# Patient Record
Sex: Female | Born: 2000
Health system: Southern US, Community
[De-identification: ages and names within clinical notes are randomized; demographics above are authoritative.]

## PROBLEM LIST (undated history)

## (undated) DIAGNOSIS — F32A Depression, unspecified: Secondary | ICD-10-CM

## (undated) DIAGNOSIS — Z9109 Other allergy status, other than to drugs and biological substances: Secondary | ICD-10-CM

## (undated) DIAGNOSIS — D649 Anemia, unspecified: Secondary | ICD-10-CM

## (undated) DIAGNOSIS — F419 Anxiety disorder, unspecified: Secondary | ICD-10-CM

## (undated) DIAGNOSIS — F909 Attention-deficit hyperactivity disorder, unspecified type: Secondary | ICD-10-CM

## (undated) HISTORY — DX: Anemia, unspecified: D64.9

## (undated) HISTORY — DX: Other allergy status, other than to drugs and biological substances: Z91.09

## (undated) HISTORY — DX: Depression, unspecified: F32.A

## (undated) HISTORY — DX: Attention-deficit hyperactivity disorder, unspecified type: F90.9

## (undated) HISTORY — DX: Anxiety disorder, unspecified: F41.9

---
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
## 2020-03-05 MED FILL — ESCITALOPRAM 20 MG TABLET: 20 | 90 days supply | Qty: 90 | Fill #1

## 2020-03-06 ENCOUNTER — Other Ambulatory Visit: Payer: Self-pay | Admitting: Adult Health

## 2020-03-06 DIAGNOSIS — F988 Other specified behavioral and emotional disorders with onset usually occurring in childhood and adolescence: Secondary | ICD-10-CM

## 2020-03-06 MED FILL — buPROPion HCL ER (XL) 150 M: 150 | 90 days supply | Qty: 90 | Fill #0

## 2020-03-14 NOTE — Progress Notes (Signed)
Annual Screening Comprehensive Examination  Assessment and Plan  Encounter for routine adult health examination without abnormal findings Never had meningococcal conjugate - patient declines Discussed meningococcal B and HPV vaccines, declines at this time, information provided and encouraged to discuss with parents Continue follow up with GYN  Health Maintenance- Discussed STD testing, safe sex, alcohol and drug awareness, drinking and driving dangers, wearing a seat belt and general safety measures for young adult.   Acne vulgaris Discussed hygiene Continue medications, improved with topical - discussing doing more regularly Given doxycycline course for persistent cystic lesion of R cheek  H/O tics Improved Monitor, refer back to neuro if needed  Auditory processing disorder Monitor   Attention deficit disorder (ADD) without hyperactivity Continue medications; previously managed by Dr. Laney Pastor, refer back for management if needing significant medication adjustments Helps with focus, no AE's. The patient was counseled on the addictive nature of the medication and was encouraged to take drug holidays when not needed.  Emphasized role of lifestyle, she is working on improving this   BMI 28 - Overweight Long discussion about weight loss, diet, and exercise Recommended diet heavy in fruits and veggies and low in animal meats, cheeses, and dairy products, appropriate calorie intake Patient will work on consistent exercise, developing good nutritional habits - working with nutritionist Discussed appropriate weight for height (below 132 lb) Follow up at next visit  Depression and anxiety Continue lexapro 20 mg, wellbutrin 150 mg daily, fair benefit, declines changes Stress management techniques discussed, increase water, good sleep hygiene discussed, increase exercise, and increase veggies.  Encouraged counseling  Family hx of thyroid dz/depression/weight gain - screening  TSH    Orders Placed This Encounter  Procedures  . CBC with Differential/Platelet  . COMPLETE METABOLIC PANEL WITH GFR  . VITAMIN D 25 Hydroxy (Vit-D Deficiency, Fractures)  . Urinalysis, Routine w reflex microscopic  . TSH    Future Appointments  Date Time Provider [place]  08/18/2020 10:30 AM Tamela Gammon, NP [facility]    HPI   This very nice 19 y.o.female,presents for complete physical. She has ADD (attention deficit disorder); Auditory processing disorder; H/O tics; Acne; and Depression with anxiety on their problem list. Tics have been evaluated by pediatric neuro, recently improved.  Took a year off due to pandemic. Will be starting [employer] college to get CMA then plans to go back for RN. 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 Lives at home, her friend has been living with her.   Newly sexually active with first boyfriend last year, referred to GYN and had IUD placed by Dr. Dellis Filbert, broke up with boyfriend 2 months ago, declines STD testing.   She has been on wellbutrin 979 mg daily, concerta 36 mg BID on school days. she states that the medication is helping  she denies any adverse reactions to medications and reports her appetite is good, sleep.  This year she reported depression/anxiety symptoms associated with new college and pandemic and is now taking lexapro 20 mg and feels this is some . Will be starting with a counselor.   She has acne, has benzamycin topical PRN and differin gel though admits she hasn't been doing this regularly. Has current cystic outbreak of R cheek, has been taking doxycycline her mom had x 3 days.   BMI is Body mass index is 28.77 kg/m., she has been working on diet - gained weight  Elenor Legato is dietician and helping her  make better choices.  - trying to eat healthier - eggs for breakfast instead of sugary cereal, trying to increase vegetables though admits not much. [place]  sandwich later in the day.  Caffeine - occasional monster energy drink, drinks coffee occasionally, not daily  Water intake improved since starting the gym,  Joined a gym - 3 days a week, 60 min, working on cardio and weights.  Wt Readings from Last 3 Encounters:  03/17/20 151 lb (68.5 kg) (83 %, Z= 0.95)*  08/14/19 121 lb (54.9 kg) (42 %, Z= -0.19)*  08/07/19 120 lb 3.2 oz (54.5 kg) (41 %, Z= -0.23)*   * Growth percentiles are based on CDC (Girls, 2-20 Years) data.   Today their BP is BP: 112/70  She does workout. She denies chest pain, shortness of breath, dizziness.  Patient is not on Vitamin D supplement.   No results found for: VD25OH       Current Outpatient Medications on File Prior to Visit  Medication Sig Dispense Refill  . adapalene (DIFFERIN) 0.1 % cream Apply pea sized amount over face nightly 45 g 1  . buPROPion (WELLBUTRIN XL) 150 MG 24 hr tablet TAKE 1 TABLET BY MOUTH DAILY FOR MOOD, FOCUS, AND CONCENTRATION 90 tablet 0  . escitalopram (LEXAPRO) 20 MG tablet TAKE 1 TABLET BY MOUTH DAILY. 90 tablet 1  . methylphenidate (CONCERTA) 36 MG PO CR tablet Take 1 to 2 tablets  Daily as needed for ADD, Focus & Concentration & try limit for occasional Drug Holidays 60 tablet 0   No current facility-administered medications on file prior to visit.    No Known Allergies  Past Medical History:  Diagnosis Date  . ADHD (attention deficit hyperactivity disorder)    Dr. Loura Back  . Anemia     Immunization History  Administered Date(s) Administered  . DTaP 06/14/2001, 08/16/2001, 10/11/2001, 07/11/2002, 06/22/2005  . Hepatitis B 03/22/2001, 06/14/2001, 01/10/2002  . HiB (PRP-OMP) 06/14/2001, 08/16/2001, 10/11/2001, 07/11/2002  . IPV 06/14/2001, 08/16/2001, 01/10/2002, 06/22/2005  . Influenza Nasal 06/15/2007, 10/10/2009, 06/11/2010, 07/05/2011, 08/22/2012  . Influenza,Quad,Nasal, Live 07/19/2013  . Influenza,inj,quad, With Preservative 06/02/2016  . MMR 04/11/2002, 06/22/2005   . Pneumococcal Conjugate-13 06/14/2001, 08/16/2001, 01/10/2002, 06/22/2005  . Tdap 07/05/2011  . Varicella 04/11/2002, 06/22/2005    Meningococcal conjugate - declines HPV - declines today   Last PAP: 08/2019 with GYN Dr. Dellis Filbert   Td due 2022 Covid 19: 2/2, 2021, moderna - will send card  Last eye exam: remote, no concerns  Last dental exam: Dr. Truman Hayward, last 2021, goes q28m   Past Surgical History:  Procedure Laterality Date  . WISDOM TOOTH EXTRACTION  2019    Family History  Problem Relation Age of Onset  . ADD / ADHD Brother   . Diabetes type I Brother   . Hypertension Father   . Migraines Father   . ADD / ADHD Father   . Migraines Maternal Grandmother   . Hypothyroidism Paternal Aunt   . Migraines Maternal Aunt   . Autism Cousin        Maternal 1st Cousin  . ADD / ADHD Cousin        Several Paternal 1st Cousins have ADHD    Social History   Socioeconomic History  . Marital status: Single    Spouse name: Not on file  . Number of children: Not on file  . Years of education: Not on file  . Highest education level: Not on file  Occupational History  . Not on  file  Tobacco Use  . Smoking status: Never Smoker  . Smokeless tobacco: Never Used  Vaping Use  . Vaping Use: Some days  Substance and Sexual Activity  . Alcohol use: No  . Drug use: Never  . Sexual activity: Not Currently    Partners: Male    Birth control/protection: None    Comment: intercourse age 25, less than 5 sexual partners  Other Topics Concern  . 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   Social Determinants of Health   Financial Resource Strain:   . Difficulty of Paying Living Expenses:   Food Insecurity:   . Worried About Charity fundraiser in the Last Year:   . Arboriculturist in the Last Year:   Transportation Needs:   . Film/video editor (Medical):   Marland Kitchen Lack of Transportation (Non-Medical):    Physical Activity:   . Days of Exercise per Week:   . Minutes of Exercise per Session:   Stress:   . Feeling of Stress :   Social Connections:   . Frequency of Communication with Friends and Family:   . Frequency of Social Gatherings with Friends and Family:   . Attends Religious Services:   . Active Member of Clubs or Organizations:   . Attends Archivist Meetings:   Marland Kitchen Marital Status:   Intimate Partner Violence:   . Fear of Current or Ex-Partner:   . Emotionally Abused:   Marland Kitchen Physically Abused:   . Sexually Abused:     ROS Review of Systems  Constitutional: Negative for malaise/fatigue and weight loss.  HENT: Negative for hearing loss and tinnitus.   Eyes: Negative for blurred vision and double vision.  Respiratory: Negative for cough, shortness of breath and wheezing.   Cardiovascular: Negative for chest pain, palpitations, orthopnea, claudication and leg swelling.  Gastrointestinal: Positive for heartburn (occasional). Negative for abdominal pain, blood in stool, constipation, diarrhea, melena, nausea and vomiting.  Genitourinary: Negative.   Musculoskeletal: Negative for joint pain and myalgias.  Skin: Negative for rash.       acne  Neurological: Negative for dizziness, tingling, sensory change, weakness and headaches.  Endo/Heme/Allergies: Negative for polydipsia.  Psychiatric/Behavioral: Negative for depression, memory loss, substance abuse and suicidal ideas. The patient is nervous/anxious. The patient does not have insomnia.   All other systems reviewed and are negative.    Physical Exam  BP 112/70   Pulse (!) 112   Temp (!) 96.8 F (36 C)   Ht 5' 0.75" (1.543 m)   Wt 151 lb (68.5 kg)   BMI 28.77 kg/m   General Appearance: Well nourished, good hygiene and in no apparent distress. Eyes: PERRLA, EOMs, conjunctiva no swelling or erythema, normal fundi and vessels. Sinuses: No frontal/maxillary tenderness ENT/Mouth: EACs patent / TMs  nl. Nares clear  without erythema, swelling, mucoid exudates. Oral hygiene is good. No erythema, swelling, or exudate. Tongue normal, non-obstructing. Tonsils not swollen or erythematous. Hearing normal.  Neck: Supple, thyroid normal. No bruits, nodes or JVD. Respiratory: Respiratory effort normal.  BS equal and clear bilateral without rales, rhonci, wheezing or stridor. Cardio: Heart sounds are normal with increased rate but normal rhythm and no murmurs, rubs or gallops. Peripheral pulses are normal and equal bilaterally without edema. No aortic or femoral bruits. Chest: symmetric with normal excursions and percussion.  Breasts: Defer to GYN Abdomen: Flat, soft, with  bowl sounds. Nontender, no guarding, rebound, hernias, masses, or organomegaly.  Lymphatics: Non tender without lymphadenopathy.  Musculoskeletal: Full ROM all peripheral extremities, joint stability, 5/5 strength, and normal gait. Skin: Warm and dry without rashes, lesions, cyanosis, clubbing or  Ecchymosis. She does have mild pustular acne to forehead and cheeks; large cystic lesion with erythema and some fluctuance, tenderness to R cheek Neuro: Cranial nerves intact, reflexes equal bilaterally. Normal muscle tone, no cerebellar symptoms. Sensation intact.  Pysch: Awake and oriented X 3, normal affect, Insight and Judgment appropriate.  GU: Defer to GYN    Continue prudent diet as discussed, weight control, regular exercise, and medications. Routine screening labs and tests as requested with regular follow-up as recommended.  Over 40 minutes of exam, counseling, chart review and critical decision making was performed  Izora Ribas, NP 10:55 AM [facility]

## 2020-03-17 ENCOUNTER — Other Ambulatory Visit: Payer: Self-pay | Admitting: Adult Health

## 2020-03-17 ENCOUNTER — Other Ambulatory Visit: Payer: Self-pay

## 2020-03-17 ENCOUNTER — Encounter: Payer: Self-pay | Admitting: Adult Health

## 2020-03-17 ENCOUNTER — Ambulatory Visit (INDEPENDENT_AMBULATORY_CARE_PROVIDER_SITE_OTHER): Payer: No Typology Code available for payment source | Admitting: Adult Health

## 2020-03-17 VITALS — BP 112/70 | HR 112 | Temp 96.8°F | Ht 60.75 in | Wt 151.0 lb

## 2020-03-17 DIAGNOSIS — Z0000 Encounter for general adult medical examination without abnormal findings: Secondary | ICD-10-CM | POA: Diagnosis not present

## 2020-03-17 DIAGNOSIS — Z1329 Encounter for screening for other suspected endocrine disorder: Secondary | ICD-10-CM

## 2020-03-17 DIAGNOSIS — L700 Acne vulgaris: Secondary | ICD-10-CM

## 2020-03-17 DIAGNOSIS — F418 Other specified anxiety disorders: Secondary | ICD-10-CM

## 2020-03-17 DIAGNOSIS — F988 Other specified behavioral and emotional disorders with onset usually occurring in childhood and adolescence: Secondary | ICD-10-CM

## 2020-03-17 DIAGNOSIS — R635 Abnormal weight gain: Secondary | ICD-10-CM

## 2020-03-17 DIAGNOSIS — Z130 Encounter for screening for diseases of the blood and blood-forming organs and certain disorders involving the immune mechanism: Secondary | ICD-10-CM

## 2020-03-17 DIAGNOSIS — Z8659 Personal history of other mental and behavioral disorders: Secondary | ICD-10-CM

## 2020-03-17 DIAGNOSIS — Z1389 Encounter for screening for other disorder: Secondary | ICD-10-CM

## 2020-03-17 DIAGNOSIS — E663 Overweight: Secondary | ICD-10-CM

## 2020-03-17 DIAGNOSIS — E669 Obesity, unspecified: Secondary | ICD-10-CM | POA: Insufficient documentation

## 2020-03-17 DIAGNOSIS — Z79899 Other long term (current) drug therapy: Secondary | ICD-10-CM

## 2020-03-17 DIAGNOSIS — Z8349 Family history of other endocrine, nutritional and metabolic diseases: Secondary | ICD-10-CM

## 2020-03-17 DIAGNOSIS — E559 Vitamin D deficiency, unspecified: Secondary | ICD-10-CM

## 2020-03-17 DIAGNOSIS — H9325 Central auditory processing disorder: Secondary | ICD-10-CM

## 2020-03-17 MED ORDER — DOXYCYCLINE HYCLATE 100 MG PO CAPS: ORAL_CAPSULE | ORAL | 0 refills | Status: DC

## 2020-03-17 MED FILL — buPROPion HCL ER (XL) 150 M: 150 | 90 days supply | Qty: 90 | Fill #0

## 2020-03-17 MED FILL — DOXYCYCLINE HYC 100 MG CAPS: 100 | 10 days supply | Qty: 20 | Fill #0

## 2020-03-17 MED FILL — ESCITALOPRAM 20 MG TABLET: 20 | 90 days supply | Qty: 90 | Fill #1

## 2020-03-17 NOTE — Patient Instructions (Addendum)
  Alexandra Horton , Thank you for taking time to come for your Annual Wellness Visit. I appreciate your ongoing commitment to your health goals. Please review the following plan we discussed and let me know if I can assist you in the future.   These are the goals we discussed: Goals    . DIET - EAT MORE FRUITS AND VEGETABLES     Aim for 7+ servings daily, 1/2 cup each    . DIET - INCREASE WATER INTAKE     65 fluid ounces of water daily    . Weight (lb) < 132 lb (59.9 kg)       This is a list of the screening recommended for you and due dates:  Health Maintenance  Topic Date Due  . HIV Screening  03/17/2021*  . Flu Shot  04/06/2020  . Chlamydia screening  08/13/2020  .  Hepatitis C: One time screening is recommended by Center for Disease Control  (CDC) for  adults born from [birth year] through 1965.   Discontinued  *Topic was postponed. The date shown is not the original due date.    Always wash face at the end of the day Recommend differin gel daily in the evening with moisturizer (I like cerave PM) Rinse face in the morning, use sunscreen daily, especially if using any topical meds (not wearing will make acne scarring worse) Benzamycin as needed   Know what a healthy weight is for you (roughly BMI <25) and aim to maintain this  Aim for 7+ servings of fruits and vegetables daily  65-80+ fluid ounces of water or unsweet tea for healthy kidneys  Limit to max 1 drink of alcohol per day; avoid smoking/tobacco  Limit animal fats in diet for cholesterol and heart health - choose grass fed whenever available  Avoid highly processed foods, and foods high in saturated/trans fats  Aim for low stress - take time to unwind and care for your mental health  Aim for 150 min of moderate intensity exercise weekly for heart health, and weights twice weekly for bone health  Aim for 7-9 hours of sleep daily    A great goal to work towards is aiming to get in a serving daily of some of the most  nutritionally dense foods - G- BOMBS daily

## 2020-03-18 ENCOUNTER — Other Ambulatory Visit: Payer: Self-pay | Admitting: Adult Health

## 2020-03-18 DIAGNOSIS — R7989 Other specified abnormal findings of blood chemistry: Secondary | ICD-10-CM | POA: Insufficient documentation

## 2020-03-18 LAB — URINALYSIS, ROUTINE W REFLEX MICROSCOPIC
Bilirubin Urine: NEGATIVE
Glucose, UA: NEGATIVE
Hgb urine dipstick: NEGATIVE
Ketones, ur: NEGATIVE
Leukocytes,Ua: NEGATIVE
Nitrite: NEGATIVE
Protein, ur: NEGATIVE
Specific Gravity, Urine: 1.019 (ref 1.001–1.03)
pH: 6.5 (ref 5.0–8.0)

## 2020-03-18 LAB — CBC WITH DIFFERENTIAL/PLATELET
Absolute Monocytes: 777 cells/uL (ref 200–900)
Basophils Absolute: 29 cells/uL (ref 0–200)
Basophils Relative: 0.5 %
Eosinophils Absolute: 110 cells/uL (ref 15–500)
Eosinophils Relative: 1.9 %
HCT: 38.8 % (ref 34.0–46.0)
Hemoglobin: 13 g/dL (ref 11.5–15.3)
Lymphs Abs: 2134 cells/uL (ref 1200–5200)
MCH: 29.5 pg (ref 25.0–35.0)
MCHC: 33.5 g/dL (ref 31.0–36.0)
MCV: 88 fL (ref 78.0–98.0)
MPV: 11 fL (ref 7.5–12.5)
Monocytes Relative: 13.4 %
Neutro Abs: 2749 cells/uL (ref 1800–8000)
Neutrophils Relative %: 47.4 %
Platelets: 276 10*3/uL (ref 140–400)
RBC: 4.41 10*6/uL (ref 3.80–5.10)
RDW: 12.5 % (ref 11.0–15.0)
Total Lymphocyte: 36.8 %
WBC: 5.8 10*3/uL (ref 4.5–13.0)

## 2020-03-18 LAB — COMPLETE METABOLIC PANEL WITH GFR
AG Ratio: 1.6 (calc) (ref 1.0–2.5)
ALT: 66 U/L — ABNORMAL HIGH (ref 5–32)
AST: 35 U/L — ABNORMAL HIGH (ref 12–32)
Albumin: 4.2 g/dL (ref 3.6–5.1)
Alkaline phosphatase (APISO): 53 U/L (ref 36–128)
BUN: 14 mg/dL (ref 7–20)
CO2: 27 mmol/L (ref 20–32)
Calcium: 9.6 mg/dL (ref 8.9–10.4)
Chloride: 106 mmol/L (ref 98–110)
Creat: 0.69 mg/dL (ref 0.50–1.00)
GFR, Est African American: 147 mL/min/{1.73_m2} (ref >=60)
GFR, Est Non African American: 127 mL/min/{1.73_m2} (ref >=60)
Globulin: 2.6 g/dL (calc) (ref 2.0–3.8)
Glucose, Bld: 109 mg/dL — ABNORMAL HIGH (ref 65–99)
Potassium: 4.5 mmol/L (ref 3.8–5.1)
Sodium: 139 mmol/L (ref 135–146)
Total Bilirubin: 0.6 mg/dL (ref 0.2–1.1)
Total Protein: 6.8 g/dL (ref 6.3–8.2)

## 2020-03-18 LAB — TSH: TSH: 0.83 mIU/L

## 2020-03-18 LAB — VITAMIN D 25 HYDROXY (VIT D DEFICIENCY, FRACTURES): Vit D, 25-Hydroxy: 11 ng/mL — ABNORMAL LOW (ref 30–100)

## 2020-03-18 MED FILL — CONCERTA 36 MG TABLET ER: 36 | 30 days supply | Qty: 60 | Fill #0

## 2020-04-15 ENCOUNTER — Other Ambulatory Visit: Payer: No Typology Code available for payment source

## 2020-04-15 ENCOUNTER — Other Ambulatory Visit: Payer: Self-pay

## 2020-04-15 DIAGNOSIS — R7989 Other specified abnormal findings of blood chemistry: Secondary | ICD-10-CM

## 2020-04-16 LAB — HEPATITIS PANEL, ACUTE
Hep A IgM: NONREACTIVE
Hep B C IgM: NONREACTIVE
Hepatitis B Surface Ag: NONREACTIVE
Hepatitis C Ab: NONREACTIVE
SIGNAL TO CUT-OFF: 0.02 (ref <1.00)

## 2020-04-16 LAB — HEPATIC FUNCTION PANEL
AG Ratio: 1.7 (calc) (ref 1.0–2.5)
ALT: 135 U/L — ABNORMAL HIGH (ref 5–32)
AST: 59 U/L — ABNORMAL HIGH (ref 12–32)
Albumin: 4 g/dL (ref 3.6–5.1)
Alkaline phosphatase (APISO): 56 U/L (ref 36–128)
Bilirubin, Direct: 0.1 mg/dL (ref 0.0–0.2)
Globulin: 2.4 g/dL (calc) (ref 2.0–3.8)
Indirect Bilirubin: 0.2 mg/dL (calc) (ref 0.2–1.1)
Total Bilirubin: 0.3 mg/dL (ref 0.2–1.1)
Total Protein: 6.4 g/dL (ref 6.3–8.2)

## 2020-04-17 ENCOUNTER — Other Ambulatory Visit: Payer: Self-pay | Admitting: Adult Health

## 2020-04-17 DIAGNOSIS — R7989 Other specified abnormal findings of blood chemistry: Secondary | ICD-10-CM

## 2020-05-20 ENCOUNTER — Ambulatory Visit
Admission: RE | Admit: 2020-05-20 | Discharge: 2020-05-20 | Disposition: A | Discharge status: Home / Self Care | Payer: No Typology Code available for payment source | Source: Ambulatory Visit | Attending: Adult Health | Admitting: Adult Health

## 2020-05-20 DIAGNOSIS — R7989 Other specified abnormal findings of blood chemistry: Secondary | ICD-10-CM

## 2020-05-23 ENCOUNTER — Other Ambulatory Visit: Payer: Self-pay | Admitting: Adult Health

## 2020-05-23 DIAGNOSIS — R7989 Other specified abnormal findings of blood chemistry: Secondary | ICD-10-CM

## 2020-06-25 ENCOUNTER — Other Ambulatory Visit: Payer: Self-pay | Admitting: Adult Health

## 2020-06-25 DIAGNOSIS — F988 Other specified behavioral and emotional disorders with onset usually occurring in childhood and adolescence: Secondary | ICD-10-CM

## 2020-06-25 DIAGNOSIS — L709 Acne, unspecified: Secondary | ICD-10-CM

## 2020-06-25 MED ORDER — METHYLPHENIDATE HCL ER (OSM) 36 MG PO TBCR: EXTENDED_RELEASE_TABLET | ORAL | 0 refills | Status: DC

## 2020-06-25 MED FILL — CONCERTA 36 MG TABLET ER: 36 | 30 days supply | Qty: 60 | Fill #0

## 2020-07-07 ENCOUNTER — Ambulatory Visit: Payer: No Typology Code available for payment source

## 2020-07-14 ENCOUNTER — Ambulatory Visit: Payer: Self-pay

## 2020-07-16 ENCOUNTER — Ambulatory Visit: Payer: Self-pay

## 2020-07-30 ENCOUNTER — Other Ambulatory Visit: Payer: Self-pay | Admitting: Adult Health

## 2020-07-30 ENCOUNTER — Other Ambulatory Visit: Payer: Self-pay | Admitting: Internal Medicine

## 2020-07-30 DIAGNOSIS — F988 Other specified behavioral and emotional disorders with onset usually occurring in childhood and adolescence: Secondary | ICD-10-CM

## 2020-07-30 MED FILL — buPROPion HCL ER (XL) 150 M: 150 | 90 days supply | Qty: 90 | Fill #0

## 2020-08-13 ENCOUNTER — Other Ambulatory Visit: Payer: Self-pay | Admitting: Internal Medicine

## 2020-08-13 ENCOUNTER — Other Ambulatory Visit: Payer: Self-pay | Admitting: Adult Health

## 2020-08-13 DIAGNOSIS — F418 Other specified anxiety disorders: Secondary | ICD-10-CM

## 2020-08-13 DIAGNOSIS — F988 Other specified behavioral and emotional disorders with onset usually occurring in childhood and adolescence: Secondary | ICD-10-CM

## 2020-08-13 MED ORDER — METHYLPHENIDATE HCL ER (OSM) 36 MG PO TBCR: EXTENDED_RELEASE_TABLET | ORAL | 0 refills | Status: DC

## 2020-08-13 MED ORDER — ESCITALOPRAM OXALATE 20 MG PO TABS: ORAL_TABLET | ORAL | 0 refills | Status: DC

## 2020-08-13 MED FILL — buPROPion HCL ER (XL) 150 M: 150 | 90 days supply | Qty: 90 | Fill #0

## 2020-08-14 MED FILL — CONCERTA 36 MG TABLET ER: 36 | 30 days supply | Qty: 60 | Fill #0

## 2020-08-14 MED FILL — ESCITALOPRAM 20 MG TABLET: 20 | 90 days supply | Qty: 90 | Fill #0

## 2020-08-18 ENCOUNTER — Encounter: Payer: 59 | Admitting: Nurse Practitioner

## 2020-08-26 ENCOUNTER — Encounter: Payer: No Typology Code available for payment source | Admitting: Nurse Practitioner

## 2020-09-18 ENCOUNTER — Ambulatory Visit: Payer: No Typology Code available for payment source | Admitting: Adult Health

## 2020-09-25 ENCOUNTER — Encounter: Payer: Self-pay | Admitting: Nurse Practitioner

## 2020-09-25 ENCOUNTER — Other Ambulatory Visit: Payer: Self-pay

## 2020-09-25 ENCOUNTER — Ambulatory Visit (INDEPENDENT_AMBULATORY_CARE_PROVIDER_SITE_OTHER): Payer: No Typology Code available for payment source | Admitting: Nurse Practitioner

## 2020-09-25 VITALS — BP 110/64 | HR 80 | Resp 14 | Ht 61.75 in | Wt 165.5 lb

## 2020-09-25 DIAGNOSIS — Z30431 Encounter for routine checking of intrauterine contraceptive device: Secondary | ICD-10-CM

## 2020-09-25 DIAGNOSIS — Z01419 Encounter for gynecological examination (general) (routine) without abnormal findings: Secondary | ICD-10-CM | POA: Diagnosis not present

## 2020-09-25 NOTE — Progress Notes (Signed)
   Alexandra Horton 2000/10/29 [id number]   History:  20 y.o. G0 presents for annual exam without GYN complaints. Amenorrheic/Mirena IUD inserted 08/2019. Has not received Gardasil and is not interested at this time. Not currently sexually active.   Gynecologic History No LMP recorded. (Menstrual status: IUD).   Contraception/Family planning: IUD  Health Maintenance Last Pap: N/A  Past medical history, past surgical history, family history and social history were all reviewed and documented in the EPIC chart.  ROS:  A ROS was performed and pertinent positives and negatives are included.  Exam:  Vitals:   09/25/20 1032  BP: 110/64  Pulse: 80  Resp: 14  Weight: 165 lb 8 oz (75.1 kg)  Height: 5' 1.75" (1.568 m)   Body mass index is 30.52 kg/m.  General appearance:  Normal Thyroid:  Symmetrical, normal in size, without palpable masses or nodularity. Respiratory  Auscultation:  Clear without wheezing or rhonchi Cardiovascular  Auscultation:  Regular rate, without rubs, murmurs or gallops  Edema/varicosities:  Not grossly evident Abdominal  Soft,nontender, without masses, guarding or rebound.  Liver/spleen:  No organomegaly noted  Hernia:  None appreciated  Skin  Inspection:  Grossly normal   Breasts: Examined lying and sitting.   Right: Without masses, retractions, discharge or axillary adenopathy.   Left: Without masses, retractions, discharge or axillary adenopathy. Gentitourinary   Inguinal/mons:  Normal without inguinal adenopathy  External genitalia:  Normal  BUS/Urethra/Skene's glands:  Normal  Vagina:  Normal  Cervix:  Normal, IUD string visible  Uterus:  Normal in size, shape and contour.  Midline and mobile  Adnexa/parametria:     Rt: Without masses or tenderness.   Lt: Without masses or tenderness.  Anus and perineum: Normal  Assessment/Plan:  20 y.o. G0 for annual exam.   Well female exam with routine gynecological exam - Education provided on SBEs,  importance of preventative screenings, current guidelines, high calcium diet, regular exercise, safe sex,  and multivitamin daily.   Encounter for routine checking of intrauterine contraceptive device (IUD) - Mirena inserted !10/2018. Amenorrheic. String visible at external os.  Follow up in 1 year for annual   Olivia Mackie [facility] [place], 10:39 AM 09/25/2020

## 2020-09-25 NOTE — Patient Instructions (Signed)
Health Maintenance, Female Adopting a healthy lifestyle and getting preventive care are important in promoting health and wellness. Ask your health care provider about:  The right schedule for you to have regular tests and exams.  Things you can do on your own to prevent diseases and keep yourself healthy. What should I know about diet, weight, and exercise? Eat a healthy diet  Eat a diet that includes plenty of vegetables, fruits, low-fat dairy products, and lean protein.  Do not eat a lot of foods that are high in solid fats, added sugars, or sodium.   Maintain a healthy weight Body mass index (BMI) is used to identify weight problems. It estimates body fat based on height and weight. Your health care provider can help determine your BMI and help you achieve or maintain a healthy weight. Get regular exercise Get regular exercise. This is one of the most important things you can do for your health. Most adults should:  Exercise for at least 150 minutes each week. The exercise should increase your heart rate and make you sweat (moderate-intensity exercise).  Do strengthening exercises at least twice a week. This is in addition to the moderate-intensity exercise.  Spend less time sitting. Even light physical activity can be beneficial. Watch cholesterol and blood lipids Have your blood tested for lipids and cholesterol at 20 years of age, then have this test every 5 years. Have your cholesterol levels checked more often if:  Your lipid or cholesterol levels are high.  You are older than 20 years of age.  You are at high risk for heart disease. What should I know about cancer screening? Depending on your health history and family history, you may need to have cancer screening at various ages. This may include screening for:  Breast cancer.  Cervical cancer.  Colorectal cancer.  Skin cancer.  Lung cancer. What should I know about heart disease, diabetes, and high blood  pressure? Blood pressure and heart disease  High blood pressure causes heart disease and increases the risk of stroke. This is more likely to develop in people who have high blood pressure readings, are of African descent, or are overweight.  Have your blood pressure checked: ? Every 3-5 years if you are 18-39 years of age. ? Every year if you are 40 years old or older. Diabetes Have regular diabetes screenings. This checks your fasting blood sugar level. Have the screening done:  Once every three years after age 40 if you are at a normal weight and have a low risk for diabetes.  More often and at a younger age if you are overweight or have a high risk for diabetes. What should I know about preventing infection? Hepatitis B If you have a higher risk for hepatitis B, you should be screened for this virus. Talk with your health care provider to find out if you are at risk for hepatitis B infection. Hepatitis C Testing is recommended for:  Everyone born from 1945 through 1965.  Anyone with known risk factors for hepatitis C. Sexually transmitted infections (STIs)  Get screened for STIs, including gonorrhea and chlamydia, if: ? You are sexually active and are younger than 20 years of age. ? You are older than 20 years of age and your health care provider tells you that you are at risk for this type of infection. ? Your sexual activity has changed since you were last screened, and you are at increased risk for chlamydia or gonorrhea. Ask your health care provider   if you are at risk.  Ask your health care provider about whether you are at high risk for HIV. Your health care provider may recommend a prescription medicine to help prevent HIV infection. If you choose to take medicine to prevent HIV, you should first get tested for HIV. You should then be tested every 3 months for as long as you are taking the medicine. Pregnancy  If you are about to stop having your period (premenopausal) and  you may become pregnant, seek counseling before you get pregnant.  Take 400 to 800 micrograms (mcg) of folic acid every day if you become pregnant.  Ask for birth control (contraception) if you want to prevent pregnancy. Osteoporosis and menopause Osteoporosis is a disease in which the bones lose minerals and strength with aging. This can result in bone fractures. If you are 65 years old or older, or if you are at risk for osteoporosis and fractures, ask your health care provider if you should:  Be screened for bone loss.  Take a calcium or vitamin D supplement to lower your risk of fractures.  Be given hormone replacement therapy (HRT) to treat symptoms of menopause. Follow these instructions at home: Lifestyle  Do not use any products that contain nicotine or tobacco, such as cigarettes, e-cigarettes, and chewing tobacco. If you need help quitting, ask your health care provider.  Do not use street drugs.  Do not share needles.  Ask your health care provider for help if you need support or information about quitting drugs. Alcohol use  Do not drink alcohol if: ? Your health care provider tells you not to drink. ? You are pregnant, may be pregnant, or are planning to become pregnant.  If you drink alcohol: ? Limit how much you use to 0-1 drink a day. ? Limit intake if you are breastfeeding.  Be aware of how much alcohol is in your drink. In the [place] one drink equals one 12 oz bottle of beer (355 mL), one 5 oz glass of wine (148 mL), or one 1 oz glass of hard liquor (44 mL). General instructions  Schedule regular health, dental, and eye exams.  Stay current with your vaccines.  Tell your health care provider if: ? You often feel depressed. ? You have ever been abused or do not feel safe at home. Summary  Adopting a healthy lifestyle and getting preventive care are important in promoting health and wellness.  Follow your health care provider's instructions about healthy  diet, exercising, and getting tested or screened for diseases.  Follow your health care provider's instructions on monitoring your cholesterol and blood pressure. This information is not intended to replace advice given to you by your health care provider. Make sure you discuss any questions you have with your health care provider. Document Revised: 08/16/2018 Document Reviewed: 08/16/2018 Elsevier Patient Education  2021 [employer] Inc[employer]  

## 2020-10-01 ENCOUNTER — Ambulatory Visit (INDEPENDENT_AMBULATORY_CARE_PROVIDER_SITE_OTHER): Payer: No Typology Code available for payment source | Admitting: Adult Health

## 2020-10-01 ENCOUNTER — Other Ambulatory Visit: Payer: Self-pay | Admitting: Adult Health

## 2020-10-01 ENCOUNTER — Encounter: Payer: Self-pay | Admitting: Adult Health

## 2020-10-01 ENCOUNTER — Other Ambulatory Visit: Payer: Self-pay

## 2020-10-01 VITALS — BP 120/82 | HR 95 | Temp 97.3°F | Wt 170.0 lb

## 2020-10-01 DIAGNOSIS — F988 Other specified behavioral and emotional disorders with onset usually occurring in childhood and adolescence: Secondary | ICD-10-CM

## 2020-10-01 DIAGNOSIS — E66811 Obesity, class 1: Secondary | ICD-10-CM

## 2020-10-01 DIAGNOSIS — E669 Obesity, unspecified: Secondary | ICD-10-CM | POA: Diagnosis not present

## 2020-10-01 DIAGNOSIS — F418 Other specified anxiety disorders: Secondary | ICD-10-CM

## 2020-10-01 MED ORDER — SERTRALINE HCL 100 MG PO TABS: 100.0000 mg | ORAL_TABLET | Freq: Every day | ORAL | 2 refills | Status: DC

## 2020-10-01 MED FILL — SERTRALINE HCL 100 MG TABS: 100 | 30 days supply | Qty: 30 | Fill #0
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
## 2020-10-01 NOTE — Patient Instructions (Signed)
Goals    . DIET - EAT MORE FRUITS AND VEGETABLES     Aim for 7+ servings daily, 1/2 cup each    . DIET - INCREASE WATER INTAKE     65 fluid ounces of water daily    . Weight (lb) < 132 lb (59.9 kg)       Try to reduce processed foods - carbohydrates and fried stuff   Stop lexapro - start zoloft 1/2 tab daily for 2 weeks then whole tab daily    High-Fiber Eating Plan Fiber, also called dietary fiber, is a type of carbohydrate. It is found foods such as fruits, vegetables, whole grains, and beans. A high-fiber diet can have many health benefits. Your health care provider may recommend a high-fiber diet to help:  Prevent constipation. Fiber can make your bowel movements more regular.  Lower your cholesterol.  Relieve the following conditions: ? Inflammation of veins in the anus (hemorrhoids). ? Inflammation of specific areas of the digestive tract (uncomplicated diverticulosis). ? A problem of the large intestine, also called the colon, that sometimes causes pain and diarrhea (irritable bowel syndrome, or IBS).  Prevent overeating as part of a weight-loss plan.  Prevent heart disease, type 2 diabetes, and certain cancers. What are tips for following this plan? Reading food labels  Check the nutrition facts label on food products for the amount of dietary fiber. Choose foods that have 5 grams of fiber or more per serving.  The goals for recommended daily fiber intake include: ? Men (age 89 or younger): 34-38 g. ? Men (over age 18): 28-34 g. ? Women (age 44 or younger): 25-28 g. ? Women (over age 31): 22-25 g. Your daily fiber goal is _____________ g.   Shopping  Choose whole fruits and vegetables instead of processed forms, such as apple juice or applesauce.  Choose a wide variety of high-fiber foods such as avocados, lentils, oats, and kidney beans.  Read the nutrition facts label of the foods you choose. Be aware of foods with added fiber. These foods often have high  sugar and sodium amounts per serving. Cooking  Use whole-grain flour for baking and cooking.  Cook with brown rice instead of white rice. Meal planning  Start the day with a breakfast that is high in fiber, such as a cereal that contains 5 g of fiber or more per serving.  Eat breads and cereals that are made with whole-grain flour instead of refined flour or white flour.  Eat brown rice, bulgur wheat, or millet instead of white rice.  Use beans in place of meat in soups, salads, and pasta dishes.  Be sure that half of the grains you eat each day are whole grains. General information  You can get the recommended daily intake of dietary fiber by: ? Eating a variety of fruits, vegetables, grains, nuts, and beans. ? Taking a fiber supplement if you are not able to take in enough fiber in your diet. It is better to get fiber through food than from a supplement.  Gradually increase how much fiber you consume. If you increase your intake of dietary fiber too quickly, you may have bloating, cramping, or gas.  Drink plenty of water to help you digest fiber.  Choose high-fiber snacks, such as berries, raw vegetables, nuts, and popcorn. What foods should I eat? Fruits Berries. Pears. Apples. Oranges. Avocado. Prunes and raisins. Dried figs. Vegetables Sweet potatoes. Spinach. Kale. Artichokes. Cabbage. Broccoli. Cauliflower. Green peas. Carrots. Squash. Grains Whole-grain breads.  Multigrain cereal. Oats and oatmeal. Brown rice. Barley. Bulgur wheat. Millet. Quinoa. Bran muffins. Popcorn. Rye wafer crackers. Meats and other proteins Navy beans, kidney beans, and pinto beans. Soybeans. Split peas. Lentils. Nuts and seeds. Dairy Fiber-fortified yogurt. Beverages Fiber-fortified soy milk. Fiber-fortified orange juice. Other foods Fiber bars. The items listed above may not be a complete list of recommended foods and beverages. Contact a dietitian for more information. What foods should I  avoid? Fruits Fruit juice. Cooked, strained fruit. Vegetables Fried potatoes. Canned vegetables. Well-cooked vegetables. Grains White bread. Pasta made with refined flour. White rice. Meats and other proteins Fatty cuts of meat. Fried chicken or fried fish. Dairy Milk. Yogurt. Cream cheese. Sour cream. Fats and oils Butters. Beverages Soft drinks. Other foods Cakes and pastries. The items listed above may not be a complete list of foods and beverages to avoid. Talk with your dietitian about what choices are best for you. Summary  Fiber is a type of carbohydrate. It is found in foods such as fruits, vegetables, whole grains, and beans.  A high-fiber diet has many benefits. It can help to prevent constipation, lower blood cholesterol, aid weight loss, and reduce your risk of heart disease, diabetes, and certain cancers.  Increase your intake of fiber gradually. Increasing fiber too quickly may cause cramping, bloating, and gas. Drink plenty of water while you increase the amount of fiber you consume.  The best sources of fiber include whole fruits and vegetables, whole grains, nuts, seeds, and beans. This information is not intended to replace advice given to you by your health care provider. Make sure you discuss any questions you have with your health care provider. Document Revised: 12/27/2019 Document Reviewed: 12/27/2019 Elsevier Patient Education  2021 Elsevier Inc.      Sertraline Tablets What is this medicine? SERTRALINE (SER tra leen) is used to treat depression. It may also be used to treat obsessive compulsive disorder, panic disorder, post-trauma stress disorder, premenstrual dysphoric disorder (PMDD) or social anxiety. This medicine may be used for other purposes; ask your health care provider or pharmacist if you have questions. COMMON BRAND NAME(S): Zoloft What should I tell my health care provider before I take this medicine? They need to know if you have any  of these conditions:  bleeding disorders  bipolar disorder or a family history of bipolar disorder  glaucoma  heart disease  high blood pressure  history of irregular heartbeat  history of low levels of calcium, magnesium, or potassium in the blood  if you often drink alcohol  liver disease  receiving electroconvulsive therapy  seizures  suicidal thoughts, plans, or attempt; a previous suicide attempt by you or a family member  take medicines that treat or prevent blood clots  thyroid disease  an unusual or allergic reaction to sertraline, other medicines, foods, dyes, or preservatives  pregnant or trying to get pregnant  breast-feeding How should I use this medicine? Take this medicine by mouth with a glass of water. Follow the directions on the prescription label. You can take it with or without food. Take your medicine at regular intervals. Do not take your medicine more often than directed. Do not stop taking this medicine suddenly except upon the advice of your doctor. Stopping this medicine too quickly may cause serious side effects or your condition may worsen. A special MedGuide will be given to you by the pharmacist with each prescription and refill. Be sure to read this information carefully each time. Talk to your pediatrician regarding  the use of this medicine in children. While this drug may be prescribed for children as young as 7 years for selected conditions, precautions do apply. Overdosage: If you think you have taken too much of this medicine contact a poison control center or emergency room at once. NOTE: This medicine is only for you. Do not share this medicine with others. What if I miss a dose? If you miss a dose, take it as soon as you can. If it is almost time for your next dose, take only that dose. Do not take double or extra doses. What may interact with this medicine? Do not take this medicine with any of the following  medications:  cisapride  dronedarone  linezolid  MAOIs like Carbex, Eldepryl, Marplan, Nardil, and Parnate  methylene blue (injected into a vein)  pimozide  thioridazine This medicine may also interact with the following medications:  alcohol  amphetamines  aspirin and aspirin-like medicines  certain medicines for depression, anxiety, or psychotic disturbances  certain medicines for fungal infections like ketoconazole, fluconazole, posaconazole, and itraconazole  certain medicines for irregular heart beat like flecainide, quinidine, propafenone  certain medicines for migraine headaches like almotriptan, eletriptan, frovatriptan, naratriptan, rizatriptan, sumatriptan, zolmitriptan  certain medicines for sleep  certain medicines for seizures like carbamazepine, valproic acid, phenytoin  certain medicines that treat or prevent blood clots like warfarin, enoxaparin, dalteparin  cimetidine  digoxin  diuretics  fentanyl  isoniazid  lithium  NSAIDs, medicines for pain and inflammation, like ibuprofen or naproxen  other medicines that prolong the QT interval (cause an abnormal heart rhythm) like dofetilide  rasagiline  safinamide  supplements like St. John's wort, kava kava, valerian  tolbutamide  tramadol  tryptophan This list may not describe all possible interactions. Give your health care provider a list of all the medicines, herbs, non-prescription drugs, or dietary supplements you use. Also tell them if you smoke, drink alcohol, or use illegal drugs. Some items may interact with your medicine. What should I watch for while using this medicine? Tell your doctor if your symptoms do not get better or if they get worse. Visit your doctor or health care professional for regular checks on your progress. Because it may take several weeks to see the full effects of this medicine, it is important to continue your treatment as prescribed by your doctor. Patients  and their families should watch out for new or worsening thoughts of suicide or depression. Also watch out for sudden changes in feelings such as feeling anxious, agitated, panicky, irritable, hostile, aggressive, impulsive, severely restless, overly excited and hyperactive, or not being able to sleep. If this happens, especially at the beginning of treatment or after a change in dose, call your health care professional. Bonita Quin may get drowsy or dizzy. Do not drive, use machinery, or do anything that needs mental alertness until you know how this medicine affects you. Do not stand or sit up quickly, especially if you are an older patient. This reduces the risk of dizzy or fainting spells. Alcohol may interfere with the effect of this medicine. Avoid alcoholic drinks. Your mouth may get dry. Chewing sugarless gum or sucking hard candy, and drinking plenty of water may help. Contact your doctor if the problem does not go away or is severe. What side effects may I notice from receiving this medicine? Side effects that you should report to your doctor or health care professional as soon as possible:  allergic reactions like skin rash, itching or hives, swelling of  the face, lips, or tongue  anxious  black, tarry stools  changes in vision  confusion  elevated mood, decreased need for sleep, racing thoughts, impulsive behavior  eye pain  fast, irregular heartbeat  feeling faint or lightheaded, falls  feeling agitated, angry, or irritable  hallucination, loss of contact with reality  loss of balance or coordination  loss of memory  painful or prolonged erections  restlessness, pacing, inability to keep still  seizures  stiff muscles  suicidal thoughts or other mood changes  trouble sleeping  unusual bleeding or bruising  unusually weak or tired  vomiting Side effects that usually do not require medical attention (report to your doctor or health care professional if they  continue or are bothersome):  change in appetite or weight  change in sex drive or performance  diarrhea  increased sweating  indigestion, nausea  tremors This list may not describe all possible side effects. Call your doctor for medical advice about side effects. You may report side effects to FDA at 1-800-FDA-1088. Where should I keep my medicine? Keep out of the reach of children. Store at room temperature between 15 and 30 degrees C (59 and 86 degrees F). Throw away any unused medicine after the expiration date. NOTE: This sheet is a summary. It may not cover all possible information. If you have questions about this medicine, talk to your doctor, pharmacist, or health care provider.  2021 Elsevier/Gold Standard (2020-07-03 09:18:23)

## 2020-10-31 ENCOUNTER — Encounter: Payer: Self-pay | Admitting: Nurse Practitioner

## 2020-11-12 NOTE — Telephone Encounter (Signed)
Appointment has been schedule for March 14.

## 2020-11-17 ENCOUNTER — Ambulatory Visit: Payer: No Typology Code available for payment source | Admitting: Nurse Practitioner

## 2020-11-22 ENCOUNTER — Encounter (INDEPENDENT_AMBULATORY_CARE_PROVIDER_SITE_OTHER): Payer: Self-pay

## 2020-11-22 ENCOUNTER — Other Ambulatory Visit: Payer: Self-pay

## 2020-11-22 ENCOUNTER — Other Ambulatory Visit: Payer: Self-pay | Admitting: Adult Health

## 2020-11-22 DIAGNOSIS — F988 Other specified behavioral and emotional disorders with onset usually occurring in childhood and adolescence: Secondary | ICD-10-CM

## 2020-11-22 MED ORDER — METHYLPHENIDATE HCL ER (OSM) 36 MG PO TBCR: EXTENDED_RELEASE_TABLET | ORAL | 0 refills | Status: DC

## 2020-11-22 MED ORDER — BUPROPION HCL ER (XL) 150 MG PO TB24: ORAL_TABLET | ORAL | 3 refills | Status: DC

## 2020-11-25 ENCOUNTER — Other Ambulatory Visit: Payer: Self-pay

## 2020-11-25 ENCOUNTER — Ambulatory Visit: Payer: No Typology Code available for payment source | Admitting: Nurse Practitioner

## 2020-11-25 ENCOUNTER — Encounter: Payer: Self-pay | Admitting: Nurse Practitioner

## 2020-11-25 VITALS — BP 100/60 | HR 72 | Wt 169.0 lb

## 2020-11-25 DIAGNOSIS — Z113 Encounter for screening for infections with a predominantly sexual mode of transmission: Secondary | ICD-10-CM

## 2020-11-25 DIAGNOSIS — Z30431 Encounter for routine checking of intrauterine contraceptive device: Secondary | ICD-10-CM

## 2020-11-25 DIAGNOSIS — R1030 Lower abdominal pain, unspecified: Secondary | ICD-10-CM | POA: Diagnosis not present

## 2020-11-25 NOTE — Progress Notes (Signed)
   Acute Office Visit  Subjective:    Patient ID: Alexandra Horton, female    DOB: 03-02-2001, 20 y.o.   MRN: [id number]   HPI 20 y.o. presents today for lower abdominal pain. This started a couple of months ago, is intermittent, pain level 4/10, and alternates sides. Nothing makes it better and nothing makes it worse. Amenorrheic. Mirena IUD inserted 08/2019. Not sexually active since October 2021. Reports normal bowel habits.   Review of Systems  Constitutional: Negative.   Gastrointestinal: Positive for abdominal pain. Negative for constipation, diarrhea, nausea and vomiting.  Genitourinary: Negative for menstrual problem and vaginal bleeding.       Objective:    Physical Exam Constitutional:      Appearance: Normal appearance.  Abdominal:     Tenderness: There is abdominal tenderness in the right lower quadrant and left lower quadrant. There is no guarding or rebound.  Genitourinary:    General: Normal vulva.     Vagina: Normal.     Cervix: Normal.     Uterus: Normal.      Comments: IUD string visible at exocervix - about 3 cm in length    BP 100/60 (BP Location: Right Arm, Patient Position: Sitting, Cuff Size: Normal)   Pulse 72   Wt 169 lb (76.7 kg)   LMP  (LMP Unknown)   BMI 31.16 kg/m  Wt Readings from Last 3 Encounters:  11/25/20 169 lb (76.7 kg) (91 %, Z= 1.37)*  10/01/20 170 lb (77.1 kg) (92 %, Z= 1.40)*  09/25/20 165 lb 8 oz (75.1 kg) (90 %, Z= 1.29)*   * Growth percentiles are based on CDC (Girls, 2-20 Years) data.        Assessment & Plan:   Problem List Items Addressed This Visit   None   Visit Diagnoses    Intermittent lower abdominal pain    -  Primary   Relevant Orders   US PELVIS TRANSVAGINAL NON-OB (TV ONLY)   Encounter for routine checking of intrauterine contraceptive device (IUD)       Screen for STD (sexually transmitted disease)       Relevant Orders   SURESWAB CT/NG/T. vaginalis      Plan: Reassurance provided on correct IUD  placement. Pain could be cystic in nature and because it has been persistent over 2-3 months we will schedule a pelvic ultrasound to rule out ovarian or uterine abnormalities. Ibuprofen as needed for pain. Screening for GC/chlamydia/trich today. She is agreeable to plan.     Olivia Mackie DNP, 4:12 PM 11/25/2020

## 2020-11-26 ENCOUNTER — Ambulatory Visit (INDEPENDENT_AMBULATORY_CARE_PROVIDER_SITE_OTHER): Payer: No Typology Code available for payment source | Admitting: Adult Health

## 2020-11-26 ENCOUNTER — Other Ambulatory Visit: Payer: Self-pay | Admitting: Adult Health

## 2020-11-26 ENCOUNTER — Encounter: Payer: Self-pay | Admitting: Adult Health

## 2020-11-26 VITALS — BP 100/64 | HR 95 | Temp 97.5°F | Wt 173.0 lb

## 2020-11-26 DIAGNOSIS — F418 Other specified anxiety disorders: Secondary | ICD-10-CM

## 2020-11-26 DIAGNOSIS — H6002 Abscess of left external ear: Secondary | ICD-10-CM | POA: Diagnosis not present

## 2020-11-26 DIAGNOSIS — R7989 Other specified abnormal findings of blood chemistry: Secondary | ICD-10-CM

## 2020-11-26 DIAGNOSIS — L709 Acne, unspecified: Secondary | ICD-10-CM

## 2020-11-26 DIAGNOSIS — E669 Obesity, unspecified: Secondary | ICD-10-CM

## 2020-11-26 DIAGNOSIS — G43009 Migraine without aura, not intractable, without status migrainosus: Secondary | ICD-10-CM | POA: Diagnosis not present

## 2020-11-26 LAB — SURESWAB CT/NG/T. VAGINALIS
C. trachomatis RNA, TMA: NOT DETECTED
N. gonorrhoeae RNA, TMA: NOT DETECTED
Trichomonas vaginalis RNA: NOT DETECTED

## 2020-11-26 MED ORDER — CIPROFLOXACIN HCL 500 MG PO TABS: 500.0000 mg | ORAL_TABLET | Freq: Two times a day (BID) | ORAL | 0 refills | Status: DC

## 2020-11-26 MED ORDER — ADAPALENE 0.1 % EX GEL: Freq: Every day | CUTANEOUS | 0 refills | Status: DC

## 2020-11-26 MED ORDER — SERTRALINE HCL 100 MG PO TABS: 100.0000 mg | ORAL_TABLET | Freq: Every day | ORAL | 3 refills | Status: DC

## 2020-11-26 MED ORDER — RIZATRIPTAN BENZOATE 10 MG PO TABS: 10.0000 mg | ORAL_TABLET | Freq: Once | ORAL | 2 refills | Status: DC | PRN

## 2020-11-26 MED FILL — RIZATRIPTAN BENZOATE 10 MG: 10 | 30 days supply | Qty: 18 | Fill #0

## 2020-11-26 MED FILL — CIPROFLOXACIN HCL 500 MG TA: 500 | 10 days supply | Qty: 20 | Fill #0

## 2020-11-26 NOTE — Patient Instructions (Addendum)
Goals    . DIET - EAT MORE FRUITS AND VEGETABLES     Aim for 7+ servings daily, 1/2 cup each    . DIET - INCREASE WATER INTAKE     65 fluid ounces of water daily    . Weight (lb) < 158 lb (71.7 kg)       Please cut out processed carbohydrate - sugar, high fructose corn syrup Avoid flour unless whole grain (try Dave's killer bread)  Beans, fresh fruits/veggies, whole grains, nuts/seeds, etc    High-Fiber Eating Plan Fiber, also called dietary fiber, is a type of carbohydrate. It is found foods such as fruits, vegetables, whole grains, and beans. A high-fiber diet can have many health benefits. Your health care provider may recommend a high-fiber diet to help:  Prevent constipation. Fiber can make your bowel movements more regular.  Lower your cholesterol.  Relieve the following conditions: ? Inflammation of veins in the anus (hemorrhoids). ? Inflammation of specific areas of the digestive tract (uncomplicated diverticulosis). ? A problem of the large intestine, also called the colon, that sometimes causes pain and diarrhea (irritable bowel syndrome, or IBS).  Prevent overeating as part of a weight-loss plan.  Prevent heart disease, type 2 diabetes, and certain cancers. What are tips for following this plan? Reading food labels  Check the nutrition facts label on food products for the amount of dietary fiber. Choose foods that have 5 grams of fiber or more per serving.  The goals for recommended daily fiber intake include: ? Men (age 68 or younger): 34-38 g. ? Men (over age 46): 28-34 g. ? Women (age 72 or younger): 25-28 g. ? Women (over age 67): 22-25 g. Your daily fiber goal is _____________ g.   Shopping  Choose whole fruits and vegetables instead of processed forms, such as apple juice or applesauce.  Choose a wide variety of high-fiber foods such as avocados, lentils, oats, and kidney beans.  Read the nutrition facts label of the foods you choose. Be aware of  foods with added fiber. These foods often have high sugar and sodium amounts per serving. Cooking  Use whole-grain flour for baking and cooking.  Cook with brown rice instead of white rice. Meal planning  Start the day with a breakfast that is high in fiber, such as a cereal that contains 5 g of fiber or more per serving.  Eat breads and cereals that are made with whole-grain flour instead of refined flour or white flour.  Eat brown rice, bulgur wheat, or millet instead of white rice.  Use beans in place of meat in soups, salads, and pasta dishes.  Be sure that half of the grains you eat each day are whole grains. General information  You can get the recommended daily intake of dietary fiber by: ? Eating a variety of fruits, vegetables, grains, nuts, and beans. ? Taking a fiber supplement if you are not able to take in enough fiber in your diet. It is better to get fiber through food than from a supplement.  Gradually increase how much fiber you consume. If you increase your intake of dietary fiber too quickly, you may have bloating, cramping, or gas.  Drink plenty of water to help you digest fiber.  Choose high-fiber snacks, such as berries, raw vegetables, nuts, and popcorn. What foods should I eat? Fruits Berries. Pears. Apples. Oranges. Avocado. Prunes and raisins. Dried figs. Vegetables Sweet potatoes. Spinach. Kale. Artichokes. Cabbage. Broccoli. Cauliflower. Green peas. Carrots. Squash. Grains Whole-grain  breads. Multigrain cereal. Oats and oatmeal. Brown rice. Barley. Bulgur wheat. Millet. Quinoa. Bran muffins. Popcorn. Rye wafer crackers. Meats and other proteins Navy beans, kidney beans, and pinto beans. Soybeans. Split peas. Lentils. Nuts and seeds. Dairy Fiber-fortified yogurt. Beverages Fiber-fortified soy milk. Fiber-fortified orange juice. Other foods Fiber bars. The items listed above may not be a complete list of recommended foods and beverages. Contact a  dietitian for more information. What foods should I avoid? Fruits Fruit juice. Cooked, strained fruit. Vegetables Fried potatoes. Canned vegetables. Well-cooked vegetables. Grains White bread. Pasta made with refined flour. White rice. Meats and other proteins Fatty cuts of meat. Fried chicken or fried fish. Dairy Milk. Yogurt. Cream cheese. Sour cream. Fats and oils Butters. Beverages Soft drinks. Other foods Cakes and pastries. The items listed above may not be a complete list of foods and beverages to avoid. Talk with your dietitian about what choices are best for you. Summary  Fiber is a type of carbohydrate. It is found in foods such as fruits, vegetables, whole grains, and beans.  A high-fiber diet has many benefits. It can help to prevent constipation, lower blood cholesterol, aid weight loss, and reduce your risk of heart disease, diabetes, and certain cancers.  Increase your intake of fiber gradually. Increasing fiber too quickly may cause cramping, bloating, and gas. Drink plenty of water while you increase the amount of fiber you consume.  The best sources of fiber include whole fruits and vegetables, whole grains, nuts, seeds, and beans. This information is not intended to replace advice given to you by your health care provider. Make sure you discuss any questions you have with your health care provider. Document Revised: 12/27/2019 Document Reviewed: 12/27/2019 Elsevier Patient Education  2021 Elsevier Inc.       Fatty liver or Nonalcoholic fatty liver disease (NASH)  Now the leading cause of liver failure in the [place].  It is normally from such risk factors as obesity, diabetes, insulin resistance, high cholesterol, or metabolic syndrome.  The only definitive therapy is weight loss and exercise.    Suggest walking 20-30 mins daily.  Decreasing carbohydrates, increasing veggies.  Vitamin E 800 IU a day may be beneficial. Liver cancer has been noted  in patient with fatty liver without cirrhosis.  Will monitor closely   Fatty Liver Fatty liver is the accumulation of fat in liver cells. It is also called hepatosteatosis or steatohepatitis. It is normal for your liver to contain some fat. If fat is more than 5 to 10% of your liver's weight, you have fatty liver.  There are often no symptoms (problems) for years while damage is still occurring. People often learn about their fatty liver when they have medical tests for other reasons. Fat can damage your liver for years or even decades without causing problems. When it becomes severe, it can cause fatigue, weight loss, weakness, and confusion. This makes you more likely to develop more serious liver problems. The liver is the largest organ in the body. It does a lot of work and often gives no warning signs when it is sick until late in a disease. The liver has many important jobs including:  Breaking down foods.  Storing vitamins, iron, and other minerals.  Making proteins.  Making bile for food digestion.  Breaking down many products including medications, alcohol and some poisons.  PROGNOSIS  Fatty liver may cause no damage or it can lead to an inflammation of the liver. This is, called steatohepatitis.  Over  time the liver may become scarred and hardened. This condition is called cirrhosis. Cirrhosis is serious and may lead to liver failure or cancer. NASH is one of the leading causes of cirrhosis. About 10-20% of Americans have fatty liver and a smaller 2-5% has NASH.  TREATMENT   Weight loss, fat restriction, and exercise in overweight patients produces inconsistent results but is worth trying.  Good control of diabetes may reduce fatty liver.  Eat a balanced, healthy diet.  Increase your physical activity.  There are no medical or surgical treatments for a fatty liver or NASH, but improving your diet and increasing your exercise may help prevent or reverse some of the  damage.

## 2020-11-26 NOTE — Progress Notes (Signed)
Assessment and Plan:  Alexandra Horton was seen today for acute visit.  Diagnoses and all orders for this visit:  Abscess of left earlobe I&D in office with verbal consent; no local, prepped with alcohol, very small stab incision with 11 blade; tolerated well with moderate purulent discharge, cultured; procedure tolerated well; topical abx/bandaid applied She can take OTC analgesic PRN mild/mod pain if needed  Will do cipro for anaerobe coverage pending cultre due to likely secondary to piercing Hygiene reviewed; follow up if not resolving or with any recurrent sx; do not reuse piercing -     ciprofloxacin (CIPRO) 500 MG tablet; Take 1 tablet (500 mg total) by mouth 2 (two) times daily for 10 days. -     Culture, Wound  Elevated LFTs Likely fatty liver secondary to 40 lb weight gain Weight loss advised, avoid alcohol/tylenol, low processed carb diet, will monitor LFTs -     Hepatic function panel -     Iron, TIBC and Ferritin Panel  Obesity (BMI 30.0-34.9) Long discussion about weight loss, diet, and exercise Recommended diet heavy in fruits and veggies and low in animal meats, cheeses, and dairy products, appropriate calorie intake Discussed appropriate weight for height and initial goal (<158lb) Follow up at next visit  Migraine without aura and without status migrainosus, not intractable -     rizatriptan (MAXALT) 10 MG tablet; Take 1 tablet (10 mg total) by mouth once as needed for migraine. May repeat in 2 hours if needed  Acne, unspecified acne type Will refer for cystic lesions and patient preference Discussed hygiene; continue avoiding comedogenic, wash face twice daily, remove makeup nightly; panoxyl at night, try differin daily except twice weekly try topical BHA, cerave moisturizer with niacinamide -     Ambulatory referral to Dermatology -     adapalene (DIFFERIN) 0.1 % gel; Apply topically at bedtime.  Depression/anxiety Improved; Continue medications  Lifestyle discussed:  diet/exerise, sleep hygiene, stress management, hydration -     sertraline (ZOLOFT) 100 MG tablet; Take 1 tablet (100 mg total) by mouth daily.   Further disposition pending results of labs. Discussed med's effects and SE's.   Over 30 minutes of exam, counseling, chart review, and critical decision making was performed.   Future Appointments  Date Time Provider Department Center  12/24/2020  4:00 PM Judd Gaudier, NP [facility] None  12/30/2020  1:30 PM [facility] Alexandra Horton RM 1 [facility] None  12/30/2020  2:20 PM Amundson Shirley Friar, MD [facility] None  03/30/2021 10:00 AM Judd Gaudier, NP [facility] None    ------------------------------------------------------------------------------------------------------------------   HPI BP 100/64   Pulse 95   Temp (!) 97.5 F (36.4 C)   Wt 173 lb (78.5 kg)   LMP  (LMP Unknown)   SpO2 97%   BMI 31.90 kg/m   20 y.o.female presents for evaluation of lump left ear.   She reports noted small tender lump behind her left ear between earrings 3-4 days ago, below piercing she got 3+ months ago, no irritation at piercing site, did remote piercing. Has noted more swollen and tender over the last few days (scant clear discharge), denies fever/chills, mildly tender but denies significant pain. Tried hot compress last night without much change.   She reports having left sided migraine without aura, occasionally with nausea, with photosensitivity, sounds and smells bother her. Tylenol/ibuprofenexcedrine haven't helped, having a few every 3-4 weeks. Will sleep in dark room to resolve. Requesting refill of maxalt which has been prescribed in the past which  worked well without SE.   Also with recent significant weight gain during pandemic (121 lb in 08/2019 to 151 lb in 03/2020, now up to 173 lb)  and new elevated LFTs at last CPE in July 2021, had negative hepatitis panel, never returned for recheck or iron/ferritin check. Alexandra Horton abd 05/2020 showed Hepatic  parenchymal echogenicity may be mildly increased with some inhomogeneity of the hepatic echotexture. Could reflect intrinsic liver disease or fatty infiltration. No focal liver lesions.  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  Wt Readings from Last 3 Encounters:  11/27/18 173 lb (78.5 kg) (93 %, Z= 1.46)*  11/25/20 169 lb (76.7 kg) (91 %, Z= 1.37)*  10/01/20 170 lb (77.1 kg) (92 %, Z= 1.40)*   * Growth percentiles are based on CDC (Girls, 2-20 Years) data.    Lab Results  Component Value Date   ALT 135 (H) 04/15/2020   AST 59 (H) 04/15/2020   BILITOT 0.3 04/15/2020   Also persistent acne since teen, has some cystic lesions and scarring to cheeks; pustules to chin. Doing panoxyl, topical niacinamide product, PRN topical benzyl peroxide; has tried Differin but not consistently. Has been on doxycycline and topical benzyl/erythromycin in the past. She is on IUD via GYN. She is requesting derm referral due to cystic lesions and scarring.   Has been on concerta 36 mg daily for many years for ADHD; continues to take PRN. Also on wellbutrin 150 mg daily and sertraline 100 mg with improvement in mood/anxiety.     Past Medical History:  Diagnosis Date  . ADHD (attention deficit hyperactivity disorder)    Dr. Verdie Mosher  . Anemia      No Known Allergies  Current Outpatient Medications on File Prior to Visit  Medication Sig  . buPROPion (WELLBUTRIN XL) 150 MG 24 hr tablet Take     1 tablet     Daily      for Mood, Focus & VConcentration  . methylphenidate (CONCERTA) 36 MG PO CR tablet Take 1 to 2 tablets Daily for ADD, Focus & Concentration & try to Limit for Occasional Drug Holidays  . sertraline (ZOLOFT) 100 MG tablet Take 1 tablet (100 mg total) by mouth daily.   No current facility-administered medications on file prior to visit.    ROS: all  negative except above.   Physical Exam:  BP 100/64   Pulse 95   Temp (!) 97.5 F (36.4 C)   Wt 173 lb (78.5 kg)   LMP  (LMP Unknown)   SpO2 97%   BMI 31.90 kg/m   General Appearance: Well nourished teen, obese, well dressed/good hygiene in no apparent distress. Eyes: PERRLA, conjunctiva no swelling or erythema ENT/Mouth: Ext aud canals clear, TMs without erythema, bulging. No erythema, swelling, or exudate on post pharynx.  Tonsils not swollen or erythematous. Hearing normal. Mastoid non-tender.  Neck: Supple, thyroid normal.  Respiratory: Respiratory effort normal, BS equal bilaterally without rales, rhonchi, wheezing or stridor.  Cardio: RRR with no MRGs. Brisk peripheral pulses without edema.  Abdomen: Soft, + BS.  Non tender, no guarding, rebound, hernias, masses. Lymphatics: Non tender without lymphadenopathy.  Musculoskeletal: No obvious deformity, normal gait.  Skin: Warm, dry; cheeks with 2-3 deep cystic lesions to each cheek, mild/mod scarring to bil cheeks, 5-6 pustular lesions to chin Left ear lob with fluctuant tender erythematous  area, below lob piercing hole, head to posterior lobe Neuro: Cranial nerves intact. Normal muscle tone, no cerebellar symptoms. Sensation intact.  Psych: Awake and oriented X 3, normal affect, Insight and Judgment appropriate.     Dan Maker, NP 2:38 PM [facility]

## 2020-11-27 LAB — IRON,TIBC AND FERRITIN PANEL
%SAT: 9 % (calc) — ABNORMAL LOW (ref 15–45)
Ferritin: 30 ng/mL (ref 16–154)
Iron: 38 ug/dL (ref 27–164)
TIBC: 402 mcg/dL (calc) (ref 271–448)

## 2020-11-27 LAB — HEPATIC FUNCTION PANEL
AG Ratio: 1.7 (calc) (ref 1.0–2.5)
ALT: 27 U/L (ref 5–32)
AST: 19 U/L (ref 12–32)
Albumin: 4.2 g/dL (ref 3.6–5.1)
Alkaline phosphatase (APISO): 57 U/L (ref 36–128)
Bilirubin, Direct: 0.1 mg/dL (ref 0.0–0.2)
Globulin: 2.5 g/dL (calc) (ref 2.0–3.8)
Indirect Bilirubin: 0.2 mg/dL (calc) (ref 0.2–1.1)
Total Bilirubin: 0.3 mg/dL (ref 0.2–1.1)
Total Protein: 6.7 g/dL (ref 6.3–8.2)

## 2020-11-30 LAB — WOUND CULTURE
MICRO NUMBER:: 11685309
RESULT:: NO GROWTH
SPECIMEN QUALITY:: ADEQUATE

## 2020-12-08 ENCOUNTER — Other Ambulatory Visit (HOSPITAL_COMMUNITY): Payer: Self-pay

## 2020-12-08 ENCOUNTER — Other Ambulatory Visit: Payer: Self-pay | Admitting: Adult Health

## 2020-12-08 MED ORDER — SERTRALINE HCL 100 MG PO TABS
ORAL_TABLET | Freq: Every day | ORAL | 3 refills | Status: DC
  Filled 2020-12-08: qty 90, 90d supply, fill #0

## 2020-12-10 ENCOUNTER — Other Ambulatory Visit (HOSPITAL_COMMUNITY): Payer: Self-pay

## 2020-12-24 ENCOUNTER — Ambulatory Visit: Payer: Self-pay | Admitting: Adult Health

## 2020-12-30 ENCOUNTER — Encounter: Payer: Self-pay | Admitting: Obstetrics and Gynecology

## 2020-12-30 ENCOUNTER — Ambulatory Visit (INDEPENDENT_AMBULATORY_CARE_PROVIDER_SITE_OTHER): Payer: No Typology Code available for payment source

## 2020-12-30 ENCOUNTER — Ambulatory Visit: Payer: No Typology Code available for payment source | Admitting: Obstetrics and Gynecology

## 2020-12-30 ENCOUNTER — Other Ambulatory Visit: Payer: Self-pay

## 2020-12-30 VITALS — BP 122/84 | HR 84 | Resp 16 | Ht 61.75 in | Wt 169.0 lb

## 2020-12-30 DIAGNOSIS — R1030 Lower abdominal pain, unspecified: Secondary | ICD-10-CM | POA: Diagnosis not present

## 2020-12-30 DIAGNOSIS — G8929 Other chronic pain: Secondary | ICD-10-CM | POA: Diagnosis not present

## 2020-12-30 DIAGNOSIS — R1032 Left lower quadrant pain: Secondary | ICD-10-CM

## 2020-12-30 DIAGNOSIS — M545 Low back pain, unspecified: Secondary | ICD-10-CM | POA: Diagnosis not present

## 2020-12-30 NOTE — Progress Notes (Signed)
GYNECOLOGY  VISIT   HPI: 20 y.o.   Single  Caucasian  female   G0P0000 with No LMP recorded. (Menstrual status: IUD).   here for pelvic ultrasound for LLQ pelvic pain of a few months duration.  It is occasional sharp cramping that can last for 10 minutes.  Also having lower back bilateral cramping.  Neg GC/CT/trich on 11/25/20.   Mirena IUD inserted in Dec, 2020.  No menses.  She was having irregular and painful menses prior to this.   Has a BM about every day.  No diarrhea.   No dysuria or blood in her urine.  Hx UTIs but none in about a year.   Cares for an 19 year old girl. Wants to move to [place]. 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  GYNECOLOGIC HISTORY: No LMP recorded. (Menstrual status: IUD). Contraception:  Mirena IUD 08/2019 Menopausal hormone therapy:  n/a Last mammogram:  n/a Last pap smear:   n/a        OB History    Gravida  0   Para  0   Term  0   Preterm  0   AB  0   Living  0     SAB  0   IAB  0   Ectopic  0   Multiple  0   Live Births  0              Patient Active Problem List   Diagnosis Date Noted  . Migraine without aura and without status migrainosus, not intractable 11/26/2020  . Elevated LFTs 03/18/2020  . Obesity (BMI 30.0-34.9) 03/17/2020  . Depression with anxiety 03/08/2019  . Acne 11/15/2017  . H/O tics 01/03/2014  . Auditory processing disorder 05/04/2013  . ADD (attention deficit disorder) 07/05/2011    Past Medical History:  Diagnosis Date  . ADHD (attention deficit hyperactivity disorder)    Dr. Verdie Mosher  . Anemia     Past Surgical History:  Procedure Laterality Date  . WISDOM TOOTH EXTRACTION  2019    Current Outpatient Medications  Medication Sig Dispense Refill  . adapalene (DIFFERIN) 0.1 % gel APPLY TO THE AFFECTED AREA(S) AT BEDTIME 45 g 0  . buPROPion (WELLBUTRIN XL) 150 MG 24 hr tablet TAKE 1 TABLET BY MOUTH DAILY FOR MOOD, FOCUS & CONCENTRATION 90 tablet 3  .  methylphenidate 36 MG PO CR tablet TAKE 1 TO 2 TABLETS DAILY FOR ADD, FOCUS & CONCENTRATION & TRY TO LIMIT FOR OCCASIONAL DRUG HOLIDAYS 60 tablet 0  . rizatriptan (MAXALT) 10 MG tablet TAKE 1 TABLET BY MOUTH ONCE AS NEEDED FOR MIGRAINE, MAY REPEAT IN 2 HOURS AS NEEDED 30 tablet 2  . sertraline (ZOLOFT) 100 MG tablet TAKE 1 TABLET BY MOUTH ONCE A DAY 90 tablet 3   No current facility-administered medications for this visit.     ALLERGIES: Patient has no known allergies.  Family History  Problem Relation Age of Onset  . ADD / ADHD Brother   . Diabetes type I Brother   . Hypertension Father   . Migraines Father   . ADD / ADHD Father   . Migraines Maternal Grandmother   . Hypothyroidism Paternal Aunt   . Migraines Maternal Aunt   . Autism Cousin        Maternal 1st Cousin  . ADD / ADHD Cousin        Several Paternal 1st Cousins have ADHD    Social History   Socioeconomic History  .  Marital status: Single    Spouse name: Not on file  . Number of children: Not on file  . Years of education: Not on file  . Highest education level: Not on file  Occupational History  . Not on file  Tobacco Use  . Smoking status: Never Smoker  . Smokeless tobacco: Never Used  Vaping Use  . Vaping Use: Some days  Substance and Sexual Activity  . Alcohol use: No  . Drug use: Never  . Sexual activity: Not Currently    Partners: Male    Birth control/protection: None    Comment: intercourse age 65, less than 5 sexual partners  Other Topics Concern  . 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   Social Determinants of Health   Financial Resource Strain: Not on file  Food Insecurity: Not on file  Transportation Needs: Not on file  Physical Activity: Not on file  Stress: Not on file  Social Connections: Not on file  Intimate Partner Violence: Not on file    Review of Systems  All other systems reviewed and are  negative.   PHYSICAL EXAMINATION:    BP 122/84 (Cuff Size: Large)   Pulse 84   Resp 16   Ht 5' 1.75" (1.568 m)   Wt 169 lb (76.7 kg)   BMI 31.16 kg/m     General appearance: alert, cooperative and appears stated age  Pelvic US Normal uterus.  IUD in endometrial canal.  Normal ovaries. Normal perfusion.  No adnexal masses. No free fluid.   Chaperone was present for exam.  ASSESSMENT  LLQ pain.  Lower back pain. Bilateral. Chronic.  Migraine without aura.  Hx elevated LFTs, which normalized.   PLAN  Reassurance regarding [place] images and report.  We discussed limits of [place] to detect endometriosis.  She declines adding a birth control pill, ring or patch to her current regimen to control pain.  I recommended dietary changes and trying a probiotic. Fu prn.   24 min  total time was spent for this patient encounter, including preparation, face-to-face counseling with the patient, coordination of care, and documentation of the encounter.

## 2021-01-07 ENCOUNTER — Other Ambulatory Visit: Payer: Self-pay | Admitting: Adult Health

## 2021-01-07 ENCOUNTER — Other Ambulatory Visit (HOSPITAL_COMMUNITY): Payer: Self-pay

## 2021-01-07 MED ORDER — SERTRALINE HCL 100 MG PO TABS
150.0000 mg | ORAL_TABLET | Freq: Every day | ORAL | 3 refills | Status: DC
  Filled 2021-01-07 – 2021-03-03 (×4): qty 135, 90d supply, fill #0
  Filled 2021-06-04: qty 135, 90d supply, fill #1
  Filled 2021-07-11 – 2021-08-19 (×3): qty 135, 90d supply, fill #2

## 2021-01-08 MED FILL — Bupropion HCl Tab ER 24HR 150 MG: ORAL | 90 days supply | Qty: 90 | Fill #0 | Status: CN

## 2021-01-09 ENCOUNTER — Telehealth: Payer: Self-pay | Admitting: Adult Health

## 2021-01-09 ENCOUNTER — Other Ambulatory Visit (HOSPITAL_COMMUNITY): Payer: Self-pay

## 2021-01-09 MED FILL — Bupropion HCl Tab ER 24HR 150 MG: ORAL | 90 days supply | Qty: 90 | Fill #0 | Status: CN

## 2021-01-09 NOTE — Telephone Encounter (Signed)
Called patient to schedule, Left voicemail to call office to schedule recommended office visit to evaluate, per [employer].  Re: poor appetite, please advise patient to schedule an appointment to come see me if not improving in 7-10 days, or sooner if any significant pain or other specific concerns. Recommend she try bland diet such as soup in the interim for upset stomach with plenty of fluids.

## 2021-01-17 ENCOUNTER — Other Ambulatory Visit (HOSPITAL_COMMUNITY): Payer: Self-pay

## 2021-01-17 MED FILL — Rizatriptan Benzoate Tab 10 MG (Base Equivalent): ORAL | 30 days supply | Qty: 18 | Fill #0 | Status: CN

## 2021-01-19 ENCOUNTER — Other Ambulatory Visit (HOSPITAL_COMMUNITY): Payer: Self-pay

## 2021-01-27 ENCOUNTER — Other Ambulatory Visit (HOSPITAL_COMMUNITY): Payer: Self-pay

## 2021-02-10 ENCOUNTER — Encounter: Payer: Self-pay | Admitting: Nurse Practitioner

## 2021-02-11 ENCOUNTER — Ambulatory Visit: Payer: No Typology Code available for payment source | Admitting: Nurse Practitioner

## 2021-02-11 NOTE — Progress Notes (Deleted)
GYNECOLOGY  VISIT  CC:   ***  HPI: 20 y.o. G0P0000 Single White or Caucasian female here for bleeding with iud.     GYNECOLOGIC HISTORY: No LMP recorded. (Menstrual status: IUD). Contraception: mirena iud inserted 09-05-2019 Menopausal hormone therapy: none  Patient Active Problem List   Diagnosis Date Noted   Migraine without aura and without status migrainosus, not intractable 11/26/2020   Elevated LFTs 03/18/2020   Obesity (BMI 30.0-34.9) 03/17/2020   Depression with anxiety 03/08/2019   Acne 11/15/2017   H/O tics 01/03/2014   Auditory processing disorder 05/04/2013   ADD (attention deficit disorder) 07/05/2011    Past Medical History:  Diagnosis Date   ADHD (attention deficit hyperactivity disorder)    Dr. Verdie Mosher   Anemia     Past Surgical History:  Procedure Laterality Date   WISDOM TOOTH EXTRACTION  2019    MEDS:   Current Outpatient Medications on File Prior to Visit  Medication Sig Dispense Refill   adapalene (DIFFERIN) 0.1 % gel APPLY TO THE AFFECTED AREA(S) AT BEDTIME 45 g 0   buPROPion (WELLBUTRIN XL) 150 MG 24 hr tablet TAKE 1 TABLET BY MOUTH DAILY FOR MOOD, FOCUS & CONCENTRATION 90 tablet 3   methylphenidate 36 MG PO CR tablet TAKE 1 TO 2 TABLETS DAILY FOR ADD, FOCUS & CONCENTRATION & TRY TO LIMIT FOR OCCASIONAL DRUG HOLIDAYS 60 tablet 0   rizatriptan (MAXALT) 10 MG tablet TAKE 1 TABLET BY MOUTH ONCE AS NEEDED FOR MIGRAINE, MAY REPEAT IN 2 HOURS AS NEEDED 30 tablet 2   sertraline (ZOLOFT) 100 MG tablet Take 1 & 1/2 tablets (150 mg total) by mouth daily. 135 tablet 3   [DISCONTINUED] escitalopram (LEXAPRO) 20 MG tablet Take      1 tablet      Daily       for Mood 90 tablet 0   No current facility-administered medications on file prior to visit.    ALLERGIES: Patient has no known allergies.  Family History  Problem Relation Age of Onset   ADD / ADHD Brother    Diabetes type I Brother    Hypertension Father    Migraines Father    ADD / ADHD Father     Migraines Maternal Grandmother    Hypothyroidism Paternal Aunt    Migraines Maternal Aunt    Autism Cousin        Maternal 1st Cousin   ADD / ADHD Cousin        Several Paternal 1st Cousins have ADHD     Review of Systems  PHYSICAL EXAMINATION:    There were no vitals taken for this visit.    General appearance: alert, cooperative, no acute distress  CV:  {Exam; heart brief:31539} Lungs:  {pe lungs ob:314451::"clear to auscultation, no wheezes, rales or rhonchi, symmetric air entry"} Breasts: {Exam; breast:13139::"normal appearance, no masses or tenderness"} Abdomen: soft, non-tender; no masses,  no organomegaly Lymph:  no inguinal LAD noted  Pelvic: External genitalia:  no lesions              Urethra:  normal appearing urethra with no masses, tenderness or lesions              Bartholins and Skenes: normal                 Vagina: normal appearing vagina               Cervix: {CHL AMB PHY EX CERVIX NORM DEFAULT:[phone]::"no lesions"}  Bimanual Exam:  Uterus:  {CHL AMB PHY EX UTERUS NORM DEFAULT:[phone]::"normal size, contour, position, consistency, mobility, non-tender"}              Adnexa: {CHL AMB PHY EX ADNEXA NO MASS [phone]::"no mass, fullness, tenderness"}               Chaperone, ***, CMA, was present for exam.  Assessment:   Plan:    {NUMBERS; -10-45 JOINT ROM:10287} minutes of total time was spent for this patient encounter, including preparation, face-to-face counseling with the patient and coordination of care, and documentation of the encounter.

## 2021-02-12 ENCOUNTER — Other Ambulatory Visit (HOSPITAL_COMMUNITY): Payer: Self-pay

## 2021-02-12 MED FILL — Rizatriptan Benzoate Tab 10 MG (Base Equivalent): ORAL | 30 days supply | Qty: 18 | Fill #0 | Status: AC

## 2021-02-16 ENCOUNTER — Other Ambulatory Visit: Payer: Self-pay

## 2021-02-16 ENCOUNTER — Other Ambulatory Visit (HOSPITAL_COMMUNITY): Payer: Self-pay

## 2021-02-16 ENCOUNTER — Ambulatory Visit: Payer: No Typology Code available for payment source | Admitting: Nurse Practitioner

## 2021-02-16 MED FILL — Bupropion HCl Tab ER 24HR 150 MG: ORAL | 90 days supply | Qty: 90 | Fill #0 | Status: CN

## 2021-02-17 ENCOUNTER — Other Ambulatory Visit (HOSPITAL_COMMUNITY)
Admission: RE | Admit: 2021-02-17 | Discharge: 2021-02-17 | Disposition: A | Discharge status: Home / Self Care | Payer: No Typology Code available for payment source | Source: Ambulatory Visit | Attending: Obstetrics and Gynecology | Admitting: Obstetrics and Gynecology

## 2021-02-17 ENCOUNTER — Ambulatory Visit: Payer: No Typology Code available for payment source | Admitting: Obstetrics and Gynecology

## 2021-02-17 ENCOUNTER — Other Ambulatory Visit (HOSPITAL_COMMUNITY): Payer: Self-pay

## 2021-02-17 ENCOUNTER — Other Ambulatory Visit: Payer: Self-pay

## 2021-02-17 ENCOUNTER — Encounter: Payer: Self-pay | Admitting: Obstetrics and Gynecology

## 2021-02-17 VITALS — BP 118/70 | Ht 61.5 in | Wt 168.0 lb

## 2021-02-17 DIAGNOSIS — Z113 Encounter for screening for infections with a predominantly sexual mode of transmission: Secondary | ICD-10-CM | POA: Diagnosis not present

## 2021-02-17 DIAGNOSIS — R102 Pelvic and perineal pain: Secondary | ICD-10-CM | POA: Diagnosis not present

## 2021-02-17 LAB — PREGNANCY, URINE: Preg Test, Ur: NEGATIVE

## 2021-02-17 MED ORDER — CYCLOBENZAPRINE HCL 10 MG PO TABS
10.0000 mg | ORAL_TABLET | Freq: Three times a day (TID) | ORAL | 0 refills | Status: DC | PRN
  Filled 2021-02-17: qty 30, 10d supply, fill #0

## 2021-02-17 NOTE — Patient Instructions (Signed)
Cyclobenzaprine tablets What is this medication? CYCLOBENZAPRINE (sye kloe BEN za preen) is a muscle relaxer. It is used totreat muscle pain, spasms, and stiffness. This medicine may be used for other purposes; ask your health care provider orpharmacist if you have questions. COMMON BRAND NAME(S): Fexmid, Flexeril What should I tell my care team before I take this medication? They need to know if you have any of these conditions: heart disease, irregular heartbeat, or previous heart attack liver disease thyroid problem an unusual or allergic reaction to cyclobenzaprine, tricyclic antidepressants, lactose, other medicines, foods, dyes, or preservatives pregnant or trying to get pregnant breast-feeding How should I use this medication? Take this medicine by mouth with a glass of water. Follow the directions on the prescription label. If this medicine upsets your stomach, take it with food or milk. Take your medicine at regular intervals. Do not take it more often thandirected. Talk to your pediatrician regarding the use of this medicine in children.Special care may be needed. Overdosage: If you think you have taken too much of this medicine contact apoison control center or emergency room at once. NOTE: This medicine is only for you. Do not share this medicine with others. What if I miss a dose? If you miss a dose, take it as soon as you can. If it is almost time for yournext dose, take only that dose. Do not take double or extra doses. What may interact with this medication? Do not take this medicine with any of the following medications: MAOIs like Carbex, Eldepryl, Marplan, Nardil, and Parnate narcotic medicines for cough safinamide This medicine may also interact with the following medications: alcohol bupropion antihistamines for allergy, cough and cold certain medicines for anxiety or sleep certain medicines for bladder problems like oxybutynin, tolterodine certain medicines for  depression like amitriptyline, fluoxetine, sertraline certain medicines for Parkinson's disease like benztropine, trihexyphenidyl certain medicines for seizures like phenobarbital, primidone certain medicines for stomach problems like dicyclomine, hyoscyamine certain medicines for travel sickness like scopolamine general anesthetics like halothane, isoflurane, methoxyflurane, propofol ipratropium local anesthetics like lidocaine, pramoxine, tetracaine medicines that relax muscles for surgery narcotic medicines for pain phenothiazines like chlorpromazine, mesoridazine, prochlorperazine, thioridazine verapamil This list may not describe all possible interactions. Give your health care provider a list of all the medicines, herbs, non-prescription drugs, or dietary supplements you use. Also tell them if you smoke, drink alcohol, or use illegaldrugs. Some items may interact with your medicine. What should I watch for while using this medication? Tell your doctor or health care professional if your symptoms do not start toget better or if they get worse. You may get drowsy or dizzy. Do not drive, use machinery, or do anything that needs mental alertness until you know how this medicine affects you. Do not stand or sit up quickly, especially if you are an older patient. This reduces the risk of dizzy or fainting spells. Alcohol may interfere with the effect ofthis medicine. Avoid alcoholic drinks. If you are taking another medicine that also causes drowsiness, you may have more side effects. Give your health care provider a list of all medicines you use. Your doctor will tell you how much medicine to take. Do not take more medicine than directed. Call emergency for help if you have problems breathingor unusual sleepiness. Your mouth may get dry. Chewing sugarless gum or sucking hard candy, and drinking plenty of water may help. Contact your doctor if the problem does notgo away or is severe. What side  effects may I  notice from receiving this medication? Side effects that you should report to your doctor or health care professionalas soon as possible: allergic reactions like skin rash, itching or hives, swelling of the face, lips, or tongue breathing problems chest pain fast, irregular heartbeat hallucinations seizures unusually weak or tired Side effects that usually do not require medical attention (report to yourdoctor or health care professional if they continue or are bothersome): headache nausea, vomiting This list may not describe all possible side effects. Call your doctor for medical advice about side effects. You may report side effects to FDA at1-800-FDA-1088. Where should I keep my medication? Keep out of the reach of children. Store at room temperature between 15 and 30 degrees C (59 and 86 degrees F). Keep container tightly closed. Throw away any unused medicine after theexpiration date. NOTE: This sheet is a summary. It may not cover all possible information. If you have questions about this medicine, talk to your doctor, pharmacist, orhealth care provider.  2022 Elsevier/Gold Standard (2018-07-26 12:49:26)

## 2021-02-17 NOTE — Progress Notes (Signed)
GYNECOLOGY  VISIT   HPI: 20 y.o.   Single  Caucasian  female   G0P0000 with No LMP recorded. (Menstrual status: IUD).   here for severe cramping on and off x 6 months.    States her cramping is worse.  Lasts 30 - 60 min or more and then goes away and returns.  Pain is sudden and more left sided. States she is unable to move when the pain occurs. No relationship of pain the anything else.  Advil does not help.  No real bleeding at the time of her pain.   Over one year since she had a period, but had two episodes of a lot of bleeding and clotting. Bleeding only lasted for 15 min or less.   Had pelvic US on 12/30/20 showing IUD in endometrial canal and normal ovaries.   Bowel movements are a few time every day.  Consistence is normal.   No dysuria.  No bleed in her urine.   No sexual partner currently.  Last partner was April once.  Female partner, no condom use.   Still satisfied with the IUD and prefers this to Nexplanon or a pill.  States breast tenderness.  No changes in her health.  She is trying to change her diet and start exercising also.  States she has put on weight.   Wants to do forensic nursing.   GYNECOLOGIC HISTORY: No LMP recorded. (Menstrual status: IUD). Contraception: Mirena dec/2020 Menopausal hormone therapy:  n/a Last mammogram:  n/a Last pap smear:  n/a        OB History     Gravida  0   Para  0   Term  0   Preterm  0   AB  0   Living  0      SAB  0   IAB  0   Ectopic  0   Multiple  0   Live Births  0              Patient Active Problem List   Diagnosis Date Noted   Migraine without aura and without status migrainosus, not intractable 11/26/2020   Elevated LFTs 03/18/2020   Obesity (BMI 30.0-34.9) 03/17/2020   Depression with anxiety 03/08/2019   Acne 11/15/2017   H/O tics 01/03/2014   Auditory processing disorder 05/04/2013   ADD (attention deficit disorder) 07/05/2011    Past Medical History:  Diagnosis  Date   ADHD (attention deficit hyperactivity disorder)    Dr. Verdie Mosher   Anemia     Past Surgical History:  Procedure Laterality Date   WISDOM TOOTH EXTRACTION  2019    Current Outpatient Medications  Medication Sig Dispense Refill   adapalene (DIFFERIN) 0.1 % gel APPLY TO THE AFFECTED AREA(S) AT BEDTIME 45 g 0   buPROPion (WELLBUTRIN XL) 150 MG 24 hr tablet TAKE 1 TABLET BY MOUTH DAILY FOR MOOD, FOCUS & CONCENTRATION 90 tablet 3   methylphenidate 36 MG PO CR tablet TAKE 1 TO 2 TABLETS DAILY FOR ADD, FOCUS & CONCENTRATION & TRY TO LIMIT FOR OCCASIONAL DRUG HOLIDAYS 60 tablet 0   rizatriptan (MAXALT) 10 MG tablet TAKE 1 TABLET BY MOUTH ONCE AS NEEDED FOR MIGRAINE, MAY REPEAT IN 2 HOURS AS NEEDED 30 tablet 2   sertraline (ZOLOFT) 100 MG tablet Take 1 & 1/2 tablets (150 mg total) by mouth daily. 135 tablet 3   No current facility-administered medications for this visit.     ALLERGIES: Lactose intolerance (gi)  Family History  Problem Relation Age of Onset   ADD / ADHD Brother    Diabetes type I Brother    Hypertension Father    Migraines Father    ADD / ADHD Father    Migraines Maternal Grandmother    Hypothyroidism Paternal Aunt    Migraines Maternal Aunt    Autism Cousin        Maternal 1st Cousin   ADD / ADHD Cousin        Several Paternal 1st Cousins have ADHD    Social History   Socioeconomic History   Marital status: Single    Spouse name: Not on file   Number of children: Not on file   Years of education: Not on file   Highest education level: Not on file  Occupational History   Not on file  Tobacco Use   Smoking status: Never   Smokeless tobacco: Never  Vaping Use   Vaping Use: Some days  Substance and Sexual Activity   Alcohol use: No   Drug use: Never   Sexual activity: Not Currently    Partners: Male    Birth control/protection: None    Comment: intercourse age 53, less than 5 sexual partners  Other Topics Concern   Not on file  Social History  Narrative   [employer]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   Social Determinants of Health   Financial Resource Strain: Not on file  Food Insecurity: Not on file  Transportation Needs: Not on file  Physical Activity: Not on file  Stress: Not on file  Social Connections: Not on file  Intimate Partner Violence: Not on file    Review of Systems  Constitutional: Negative.   HENT: Negative.    Eyes: Negative.   Respiratory: Negative.    Cardiovascular: Negative.   Gastrointestinal: Negative.   Endocrine: Negative.   Genitourinary: Negative.   Musculoskeletal: Negative.   Skin: Negative.   Allergic/Immunologic: Negative.   Neurological: Negative.   Hematological: Negative.   Psychiatric/Behavioral: Negative.     PHYSICAL EXAMINATION:    BP 118/70   Ht 5' 1.5" (1.562 m)   Wt 168 lb (76.2 kg)   BMI 31.23 kg/m     General appearance: alert, cooperative and appears stated age   Pelvic: External genitalia:  no lesions              Urethra:  normal appearing urethra with no masses, tenderness or lesions              Bartholins and Skenes: normal                 Vagina: normal appearing vagina with normal color and discharge, no lesions              Cervix: no lesions                Bimanual Exam:  Uterus:  normal size, contour, position, consistency, mobility, non-tender on right.  Some tenderness on left side.               Adnexa: no mass, fullness, tenderness                Chaperone was present for exam.  ASSESSMENT  Pelvic pain.  Uncertain etiology.  Endometriosis?  Fibrosis?  Bowel origin?  Musculoskeletal? Mirena IUD.  Migraine without aura.  PLAN  GC/CT/trichomonas testing.  She declines a course of combined oral contraception.  Does not  tolerate well. Try a course of Flexeril. Check UPT now.  We discussed laparoscopy as a way to diagnose and treat endometriosis and adhesive disease. FU prn.   24 min total time was spent for this  patient encounter, including preparation, face-to-face counseling with the patient, coordination of care, and documentation of the encounter.

## 2021-02-18 LAB — CERVICOVAGINAL ANCILLARY ONLY
Chlamydia: NEGATIVE
Comment: NEGATIVE
Comment: NEGATIVE
Comment: NORMAL
Neisseria Gonorrhea: NEGATIVE
Trichomonas: NEGATIVE

## 2021-02-24 ENCOUNTER — Other Ambulatory Visit (HOSPITAL_COMMUNITY): Payer: Self-pay

## 2021-02-26 ENCOUNTER — Other Ambulatory Visit (HOSPITAL_COMMUNITY): Payer: Self-pay

## 2021-03-03 ENCOUNTER — Encounter: Payer: Self-pay | Admitting: Nurse Practitioner

## 2021-03-03 ENCOUNTER — Other Ambulatory Visit: Payer: Self-pay | Admitting: Adult Health

## 2021-03-03 DIAGNOSIS — F988 Other specified behavioral and emotional disorders with onset usually occurring in childhood and adolescence: Secondary | ICD-10-CM

## 2021-03-03 MED FILL — Bupropion HCl Tab ER 24HR 150 MG: ORAL | 90 days supply | Qty: 90 | Fill #0 | Status: AC

## 2021-03-04 ENCOUNTER — Other Ambulatory Visit (HOSPITAL_COMMUNITY): Payer: Self-pay

## 2021-03-04 MED ORDER — METHYLPHENIDATE HCL ER (OSM) 36 MG PO TBCR
EXTENDED_RELEASE_TABLET | ORAL | 0 refills | Status: DC
  Filled 2021-03-04: qty 60, 30d supply, fill #0

## 2021-03-11 ENCOUNTER — Encounter: Payer: Self-pay | Admitting: Nurse Practitioner

## 2021-03-13 NOTE — Telephone Encounter (Signed)
Please schedule patient for OV with provider. It does not have to be today.

## 2021-03-30 ENCOUNTER — Encounter: Payer: No Typology Code available for payment source | Admitting: Adult Health

## 2021-04-23 ENCOUNTER — Other Ambulatory Visit (HOSPITAL_COMMUNITY): Payer: Self-pay

## 2021-04-23 ENCOUNTER — Other Ambulatory Visit: Payer: Self-pay | Admitting: Adult Health

## 2021-04-23 DIAGNOSIS — F988 Other specified behavioral and emotional disorders with onset usually occurring in childhood and adolescence: Secondary | ICD-10-CM

## 2021-04-23 MED ORDER — METHYLPHENIDATE HCL ER (OSM) 36 MG PO TBCR
EXTENDED_RELEASE_TABLET | ORAL | 0 refills | Status: DC
Start: 2021-04-23 — End: 2021-06-03
  Filled 2021-04-23: qty 60, 30d supply, fill #0

## 2021-04-23 MED FILL — Rizatriptan Benzoate Tab 10 MG (Base Equivalent): ORAL | 30 days supply | Qty: 18 | Fill #1 | Status: AC

## 2021-04-29 NOTE — Progress Notes (Signed)
Complete Physical  Assessment and Plan:  Noelene was seen today for annual exam.  Diagnoses and all orders for this visit:  Encounter for routine adult health examination without abnormal findings  Due Annually  Refused Pap, will plan for next year  Refused Tetanus, will plan for next year or if gets cut  Attention deficit disorder (ADD) without hyperactivity  Continue Concerta CR 81EH 1-2 tabs daily  Depression with anxiety  Continue Wellbutrin and Zoloft, but is not interested in medication changes at this time.  She is given names for several counselors to begin therapy and pt is very interrested.   Obesity (BMI 30-39.9) -     COMPLETE METABOLIC PANEL WITH GFR -     Lipid panel - Long discussion on diet and exercsie, importance of fresh fruits/vegetables and fiber.  Encouraged adequate water intake and limiting processed/prepared foods.  Also stressed the importance of daily exercsie  Screening for deficiency anemia -     CBC with Differential/Platelet  Screening for thyroid disorder -     TSH  Screening for hematuria or proteinuria -     Urinalysis, Routine w reflex microscopic -     Microalbumin / creatinine urine ratio  Vitamin D deficiency -     VITAMIN D 25 Hydroxy (Vit-D Deficiency, Fractures) - Stressed importance of Vit D supplementation, last value was 11  Screening, lipid -     Lipid panel       - Stressed importance of diet and exercsie  Medication management -     CBC with Differential/Platelet -     COMPLETE METABOLIC PANEL WITH GFR -     Lipid panel -     TSH -     Hemoglobin A1c -     VITAMIN D 25 Hydroxy (Vit-D Deficiency, Fractures) -     Magnesium -     Urinalysis, Routine w reflex microscopic -     Microalbumin / creatinine urine ratio  Screening for diabetes mellitus -     Hemoglobin A1c  Acne vulgaris -     Ambulatory referral to Dermatology - Continue current treatment regimen until evaluated by derm  Environmental allergies -      Ambulatory referral to Allergy        - Continue Zyrtec daily  Discussed med's effects and SE's. Screening labs and tests as requested with regular follow-up as recommended. Over 40 minutes of exam, counseling, chart review and critical decision making was performed  HPI  This very nice 20 y.o.female presents for complete physical.  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   Patient has depression and is on medication but interested in pursuing psychotherapy and would like a therapist referral.  She does still have a moderate amount of depression symptoms but is happy with current medications.  Denies suicidal/homicidal thoughts  She is noticing itchy/irritated eyes which she believes is related to allergies. Symptoms started 2 months ago.  Occasional skin irritation and scratchy throat.  She is wanting allergy testing.  She is doing Zyrtec but not relieving symptoms.  She has acne vulgaris and was referred to dermatologist last year but never heard from dermatologist. Would like a dermatology referral.  She does workout.  BMI is Body mass index is 31.86 kg/m., she has been working on diet and exercise. Wt Readings from Last 3 Encounters:  04/30/21 171 lb 6.4 oz (77.7 kg)  02/17/21 168 lb (76.2 kg) (91 %, Z= 1.33)*  12/30/20 169  lb (76.7 kg) (91 %, Z= 1.36)*   * Growth percentiles are based on CDC (Girls, 2-20 Years) data.    Finally, patient has history of Vitamin D Deficiency not is not taking supplementation and last vitamin D was  Lab Results  Component Value Date   VD25OH 11 (L) 03/17/2020    Last CBC was [facility] Lab Results  Component Value Date   WBC 5.8 03/17/2020   HGB 13.0 03/17/2020   HCT 38.8 03/17/2020   MCV 88.0 03/17/2020   PLT 276 03/17/2020      Current Medications:  Current Outpatient Medications on File Prior to Visit  Medication Sig Dispense Refill   adapalene (DIFFERIN) 0.1 % gel APPLY TO THE AFFECTED AREA(S) AT BEDTIME 45  g 0   buPROPion (WELLBUTRIN XL) 150 MG 24 hr tablet TAKE 1 TABLET BY MOUTH DAILY FOR MOOD, FOCUS & CONCENTRATION 90 tablet 3   methylphenidate (CONCERTA) 36 MG PO CR tablet Take 1 to 2 tablets by mouth daily for ADD/focus, do not use daily in order to avoid tolerance and addiction. 60 tablet 0   rizatriptan (MAXALT) 10 MG tablet TAKE 1 TABLET BY MOUTH ONCE AS NEEDED FOR MIGRAINE, MAY REPEAT IN 2 HOURS AS NEEDED 30 tablet 2   sertraline (ZOLOFT) 100 MG tablet Take 1 & 1/2 tablets (150 mg total) by mouth daily. 135 tablet 3   cyclobenzaprine (FLEXERIL) 10 MG tablet Take 1 tablet (10 mg total) by mouth 3 (three) times daily as needed for muscle spasms. (Patient not taking: Reported on 04/30/2021) 30 tablet 0   [DISCONTINUED] escitalopram (LEXAPRO) 20 MG tablet Take      1 tablet      Daily       for Mood 90 tablet 0   No current facility-administered medications on file prior to visit.   Health Maintenance:   Immunization History  Administered Date(s) Administered   DTaP 06/14/2001, 08/16/2001, 10/11/2001, 07/11/2002, 06/22/2005   Hepatitis B April 11, 2001, 06/14/2001, 01/10/2002   HiB (PRP-OMP) 06/14/2001, 08/16/2001, 10/11/2001, 07/11/2002   IPV 06/14/2001, 08/16/2001, 01/10/2002, 06/22/2005   Influenza Nasal 06/15/2007, 10/10/2009, 06/11/2010, 07/05/2011, 08/22/2012   Influenza,Quad,Nasal, Live 07/19/2013   Influenza,inj,quad, With Preservative 06/02/2016   MMR 04/11/2002, 06/22/2005   Moderna Sars-Covid-2 Vaccination 12/04/2019, 01/18/2020   PPD Test 07/18/2020, 09/19/2020   Pneumococcal Conjugate-13 06/14/2001, 08/16/2001, 01/10/2002, 06/22/2005   Tdap 07/05/2011   Varicella 04/11/2002, 06/22/2005    TD/TDAP: 10/29 2012 declines today Influenza: 06/2020 Pneumovax:N/A  Prevnar 13: N/A HPV vaccines: Declines  LMP: Mirena x 2 years, no period since 11/2019 Sexually Active: no STD testing GC/ Chlamydia/ trichomonas done 02/17/21 all negative Pap: Never- declines today MGM:  N/A  Allergies:  Allergies  Allergen Reactions   Lactose Intolerance (Gi)    Medical History:  has ADD (attention deficit disorder); Auditory processing disorder; H/O tics; Acne; Depression with anxiety; Obesity (BMI 30.0-34.9); Elevated LFTs; and Migraine without aura and without status migrainosus, not intractable on their problem list. Surgical History:  She  has a past surgical history that includes Wisdom tooth extraction (2019). Family History:  Her family history includes ADD / ADHD in her brother, cousin, and father; Autism in her cousin; Diabetes type I in her brother; Hypertension in her father; Hypothyroidism in her paternal aunt; Migraines in her father, maternal aunt, and maternal grandmother. Social History:   reports that she has never smoked. She has never used smokeless tobacco. She reports that she does not drink alcohol and does not use drugs.  Review of  Systems: Review of Systems  Constitutional:  Negative for chills and fever.  HENT:  Negative for congestion, hearing loss, sinus pain, sore throat and tinnitus.   Eyes:  Positive for redness. Negative for blurred vision and double vision.  Respiratory:  Negative for cough, hemoptysis, sputum production, shortness of breath and wheezing.   Cardiovascular:  Negative for chest pain, palpitations and leg swelling.  Gastrointestinal:  Negative for abdominal pain, constipation, diarrhea, heartburn, nausea and vomiting.  Genitourinary:  Negative for dysuria and urgency.  Musculoskeletal:  Negative for back pain, falls, joint pain, myalgias and neck pain.  Skin:  Negative for rash.       Cystic acne of face  Neurological:  Negative for dizziness, tingling, tremors, weakness and headaches.  Endo/Heme/Allergies:  Does not bruise/bleed easily.  Psychiatric/Behavioral:  Positive for depression. Negative for suicidal ideas. The patient is not nervous/anxious and does not have insomnia.    Physical Exam: Estimated body mass  index is 31.86 kg/m as calculated from the following:   Height as of this encounter: 5' 1.5" (1.562 m).   Weight as of this encounter: 171 lb 6.4 oz (77.7 kg). BP 112/84   Pulse (!) 133   Temp (!) 97.5 F (36.4 C)   Ht 5' 1.5" (1.562 m)   Wt 171 lb 6.4 oz (77.7 kg)   LMP  (LMP Unknown)   SpO2 94%   BMI 31.86 kg/m  General Appearance: Well nourished, in no apparent distress.  Eyes: Noticed blinking tic. PERRLA, EOMs, conjunctiva no swelling or erythema, normal fundi and vessels.  Sinuses: No Frontal/maxillary tenderness  ENT/Mouth: Ext aud canals clear, normal light reflex with TMs without erythema, bulging. Good dentition. No erythema, swelling, or exudate on post pharynx. Tonsils not swollen or erythematous. Hearing normal.  Neck: Supple, thyroid normal. No bruits  Respiratory: Respiratory effort normal, BS equal bilaterally without rales, rhonchi, wheezing or stridor.  Cardio: RRR without murmurs, rubs or gallops. Brisk peripheral pulses without edema.  Chest: symmetric, with normal excursions and percussion.  Breasts: Symmetric, without lumps, nipple discharge, retractions.  Abdomen: Soft, nontender, no guarding, rebound, hernias, masses, or organomegaly.  Lymphatics: Non tender without lymphadenopathy.  Genitourinary: Defer per patient refusal Musculoskeletal: Full ROM all peripheral extremities,5/5 strength, and normal gait.  Skin: Warm, dry without rashes, noticeable cystic acne of face Neuro: Cranial nerves intact, reflexes equal bilaterally. Normal muscle tone, no cerebellar symptoms. Sensation intact.  Psych: Awake and oriented X 3, normal affect, Insight and Judgment appropriate.   EKG: defer  Malena Timpone W Jovanne Riggenbach 10:59 AM [facility]

## 2021-04-30 ENCOUNTER — Other Ambulatory Visit: Payer: Self-pay

## 2021-04-30 ENCOUNTER — Ambulatory Visit (INDEPENDENT_AMBULATORY_CARE_PROVIDER_SITE_OTHER): Payer: No Typology Code available for payment source | Admitting: Nurse Practitioner

## 2021-04-30 ENCOUNTER — Encounter: Payer: Self-pay | Admitting: Nurse Practitioner

## 2021-04-30 VITALS — BP 112/84 | HR 133 | Temp 97.5°F | Ht 61.5 in | Wt 171.4 lb

## 2021-04-30 DIAGNOSIS — Z13220 Encounter for screening for lipoid disorders: Secondary | ICD-10-CM

## 2021-04-30 DIAGNOSIS — Z79899 Other long term (current) drug therapy: Secondary | ICD-10-CM | POA: Diagnosis not present

## 2021-04-30 DIAGNOSIS — Z131 Encounter for screening for diabetes mellitus: Secondary | ICD-10-CM

## 2021-04-30 DIAGNOSIS — F988 Other specified behavioral and emotional disorders with onset usually occurring in childhood and adolescence: Secondary | ICD-10-CM

## 2021-04-30 DIAGNOSIS — Z9109 Other allergy status, other than to drugs and biological substances: Secondary | ICD-10-CM

## 2021-04-30 DIAGNOSIS — Z1389 Encounter for screening for other disorder: Secondary | ICD-10-CM | POA: Diagnosis not present

## 2021-04-30 DIAGNOSIS — L700 Acne vulgaris: Secondary | ICD-10-CM

## 2021-04-30 DIAGNOSIS — Z130 Encounter for screening for diseases of the blood and blood-forming organs and certain disorders involving the immune mechanism: Secondary | ICD-10-CM

## 2021-04-30 DIAGNOSIS — E559 Vitamin D deficiency, unspecified: Secondary | ICD-10-CM | POA: Diagnosis not present

## 2021-04-30 DIAGNOSIS — E669 Obesity, unspecified: Secondary | ICD-10-CM

## 2021-04-30 DIAGNOSIS — F418 Other specified anxiety disorders: Secondary | ICD-10-CM

## 2021-04-30 DIAGNOSIS — Z1329 Encounter for screening for other suspected endocrine disorder: Secondary | ICD-10-CM

## 2021-04-30 DIAGNOSIS — Z0000 Encounter for general adult medical examination without abnormal findings: Secondary | ICD-10-CM | POA: Diagnosis not present

## 2021-04-30 NOTE — Patient Instructions (Signed)
  [facility] is a [facility] [employer]. We offer caring, insightful, warm, supportive treatment to people who are suffering from anxiety/depression or who just want to feel better. We are here to help.    Get In Touch Address [address] [place], [place], [place] [address]  Phone Number & Email [phone] [email or web address]   [facility]  [email or web address] [phone]  GENERAL HEALTH GOALS   Know what a healthy weight is for you (roughly BMI <25) and aim to maintain this   Aim for 7+ servings of fruits and vegetables daily   70-80+ fluid ounces of water or unsweet tea for healthy kidneys   Limit to max 1 drink of alcohol per day; avoid smoking/tobacco   Limit animal fats in diet for cholesterol and heart health - choose grass fed whenever available   Avoid highly processed foods, and foods high in saturated/trans fats   Aim for low stress - take time to unwind and care for your mental health   Aim for 150 min of moderate intensity exercise weekly for heart health, and weights twice weekly for bone health   Aim for 7-9 hours of sleep daily

## 2021-05-01 LAB — CBC WITH DIFFERENTIAL/PLATELET
Absolute Monocytes: 1168 cells/uL — ABNORMAL HIGH (ref 200–950)
Basophils Absolute: 50 cells/uL (ref 0–200)
Basophils Relative: 0.5 %
Eosinophils Absolute: 109 cells/uL (ref 15–500)
Eosinophils Relative: 1.1 %
HCT: 42.4 % (ref 35.0–45.0)
Hemoglobin: 13.3 g/dL (ref 11.7–15.5)
Lymphs Abs: 2663 cells/uL (ref 850–3900)
MCH: 27 pg (ref 27.0–33.0)
MCHC: 31.4 g/dL — ABNORMAL LOW (ref 32.0–36.0)
MCV: 86.2 fL (ref 80.0–100.0)
MPV: 10.6 fL (ref 7.5–12.5)
Monocytes Relative: 11.8 %
Neutro Abs: 5910 cells/uL (ref 1500–7800)
Neutrophils Relative %: 59.7 %
Platelets: 304 10*3/uL (ref 140–400)
RBC: 4.92 10*6/uL (ref 3.80–5.10)
RDW: 13.4 % (ref 11.0–15.0)
Total Lymphocyte: 26.9 %
WBC: 9.9 10*3/uL (ref 3.8–10.8)

## 2021-05-01 LAB — HEMOGLOBIN A1C
Hgb A1c MFr Bld: 5.3 % of total Hgb (ref <5.7)
Mean Plasma Glucose: 105 mg/dL
eAG (mmol/L): 5.8 mmol/L

## 2021-05-01 LAB — URINALYSIS, ROUTINE W REFLEX MICROSCOPIC
Bacteria, UA: NONE SEEN /HPF
Bilirubin Urine: NEGATIVE
Glucose, UA: NEGATIVE
Hgb urine dipstick: NEGATIVE
Hyaline Cast: NONE SEEN /LPF
Ketones, ur: NEGATIVE
Nitrite: NEGATIVE
Protein, ur: NEGATIVE
RBC / HPF: NONE SEEN /HPF (ref 0–2)
Specific Gravity, Urine: 1.016 (ref 1.001–1.035)
pH: 6.5 (ref 5.0–8.0)

## 2021-05-01 LAB — VITAMIN D 25 HYDROXY (VIT D DEFICIENCY, FRACTURES): Vit D, 25-Hydroxy: 18 ng/mL — ABNORMAL LOW (ref 30–100)

## 2021-05-01 LAB — COMPLETE METABOLIC PANEL WITH GFR
AG Ratio: 1.7 (calc) (ref 1.0–2.5)
ALT: 24 U/L (ref 6–29)
AST: 18 U/L (ref 10–30)
Albumin: 4.7 g/dL (ref 3.6–5.1)
Alkaline phosphatase (APISO): 65 U/L (ref 31–125)
BUN: 14 mg/dL (ref 7–25)
CO2: 27 mmol/L (ref 20–32)
Calcium: 10.3 mg/dL — ABNORMAL HIGH (ref 8.6–10.2)
Chloride: 102 mmol/L (ref 98–110)
Creat: 0.8 mg/dL (ref 0.50–0.96)
Globulin: 2.7 g/dL (calc) (ref 1.9–3.7)
Glucose, Bld: 86 mg/dL (ref 65–99)
Potassium: 4 mmol/L (ref 3.5–5.3)
Sodium: 136 mmol/L (ref 135–146)
Total Bilirubin: 0.3 mg/dL (ref 0.2–1.2)
Total Protein: 7.4 g/dL (ref 6.1–8.1)
eGFR: 108 mL/min/{1.73_m2} (ref >=60)

## 2021-05-01 LAB — MICROALBUMIN / CREATININE URINE RATIO
Creatinine, Urine: 76 mg/dL (ref 20–275)
Microalb Creat Ratio: 18 mcg/mg creat (ref <30)
Microalb, Ur: 1.4 mg/dL

## 2021-05-01 LAB — TSH: TSH: 1.4 mIU/L

## 2021-05-01 LAB — LIPID PANEL
Cholesterol: 187 mg/dL (ref <200)
HDL: 54 mg/dL (ref >=50)
LDL Cholesterol (Calc): 107 mg/dL (calc) — ABNORMAL HIGH
Non-HDL Cholesterol (Calc): 133 mg/dL (calc) — ABNORMAL HIGH (ref <130)
Total CHOL/HDL Ratio: 3.5 (calc) (ref <5.0)
Triglycerides: 150 mg/dL — ABNORMAL HIGH (ref <150)

## 2021-05-01 LAB — MAGNESIUM: Magnesium: 2.1 mg/dL (ref 1.5–2.5)

## 2021-05-01 LAB — MICROSCOPIC MESSAGE

## 2021-05-10 ENCOUNTER — Encounter: Payer: Self-pay | Admitting: Nurse Practitioner

## 2021-05-13 ENCOUNTER — Encounter: Payer: Self-pay | Admitting: Anesthesiology

## 2021-05-21 ENCOUNTER — Encounter: Payer: Self-pay | Admitting: Anesthesiology

## 2021-06-02 ENCOUNTER — Ambulatory Visit: Payer: No Typology Code available for payment source | Admitting: Nurse Practitioner

## 2021-06-03 ENCOUNTER — Other Ambulatory Visit: Payer: Self-pay | Admitting: Nurse Practitioner

## 2021-06-03 DIAGNOSIS — F988 Other specified behavioral and emotional disorders with onset usually occurring in childhood and adolescence: Secondary | ICD-10-CM

## 2021-06-04 ENCOUNTER — Other Ambulatory Visit (HOSPITAL_COMMUNITY): Payer: Self-pay

## 2021-06-04 MED ORDER — METHYLPHENIDATE HCL ER (OSM) 36 MG PO TBCR
EXTENDED_RELEASE_TABLET | ORAL | 0 refills | Status: DC
Start: 2021-06-04 — End: 2021-07-11
  Filled 2021-06-04: qty 60, 30d supply, fill #0

## 2021-06-04 MED FILL — Rizatriptan Benzoate Tab 10 MG (Base Equivalent): ORAL | 30 days supply | Qty: 18 | Fill #2 | Status: AC

## 2021-06-04 MED FILL — Adapalene Gel 0.1%: CUTANEOUS | Qty: 45 | Fill #0 | Status: CN

## 2021-06-04 MED FILL — Bupropion HCl Tab ER 24HR 150 MG: ORAL | 90 days supply | Qty: 90 | Fill #1 | Status: AC

## 2021-06-05 ENCOUNTER — Encounter: Payer: Self-pay | Admitting: Nurse Practitioner

## 2021-06-15 ENCOUNTER — Ambulatory Visit: Payer: No Typology Code available for payment source | Admitting: Nurse Practitioner

## 2021-06-18 NOTE — Telephone Encounter (Signed)
Can she just come in for nurse visit to get tetanus shot

## 2021-06-19 ENCOUNTER — Other Ambulatory Visit (HOSPITAL_COMMUNITY): Payer: Self-pay

## 2021-06-19 MED ORDER — SPIRONOLACTONE 50 MG PO TABS
ORAL_TABLET | ORAL | 0 refills | Status: DC
  Filled 2021-06-19: qty 60, 30d supply, fill #0

## 2021-06-19 MED ORDER — DOXYCYCLINE HYCLATE 100 MG PO CAPS
ORAL_CAPSULE | ORAL | 2 refills | Status: DC
  Filled 2021-06-19: qty 60, 30d supply, fill #0

## 2021-06-19 MED ORDER — TRETINOIN 0.05 % EX CREA
TOPICAL_CREAM | CUTANEOUS | 3 refills | Status: DC
  Filled 2021-06-19: qty 45, 30d supply, fill #0

## 2021-06-24 NOTE — Telephone Encounter (Signed)
Can she come today.

## 2021-07-11 ENCOUNTER — Other Ambulatory Visit: Payer: Self-pay | Admitting: Nurse Practitioner

## 2021-07-11 ENCOUNTER — Other Ambulatory Visit (HOSPITAL_COMMUNITY): Payer: Self-pay

## 2021-07-11 DIAGNOSIS — F988 Other specified behavioral and emotional disorders with onset usually occurring in childhood and adolescence: Secondary | ICD-10-CM

## 2021-07-11 MED ORDER — METHYLPHENIDATE HCL ER (OSM) 36 MG PO TBCR
EXTENDED_RELEASE_TABLET | ORAL | 0 refills | Status: DC
Start: 2021-07-11 — End: 2021-08-19
  Filled 2021-07-11: qty 60, 30d supply, fill #0

## 2021-07-12 MED FILL — Rizatriptan Benzoate Tab 10 MG (Base Equivalent): ORAL | 30 days supply | Qty: 18 | Fill #3 | Status: AC

## 2021-07-13 ENCOUNTER — Encounter: Payer: Self-pay | Admitting: Nurse Practitioner

## 2021-07-13 ENCOUNTER — Other Ambulatory Visit (HOSPITAL_COMMUNITY): Payer: Self-pay

## 2021-07-13 ENCOUNTER — Ambulatory Visit: Payer: No Typology Code available for payment source | Admitting: Nurse Practitioner

## 2021-07-14 ENCOUNTER — Other Ambulatory Visit (HOSPITAL_COMMUNITY): Payer: Self-pay

## 2021-07-14 MED FILL — Bupropion HCl Tab ER 24HR 150 MG: ORAL | 90 days supply | Qty: 90 | Fill #2 | Status: CN

## 2021-07-15 ENCOUNTER — Encounter: Payer: Self-pay | Admitting: Allergy

## 2021-07-15 ENCOUNTER — Other Ambulatory Visit: Payer: Self-pay

## 2021-07-15 ENCOUNTER — Ambulatory Visit: Payer: No Typology Code available for payment source | Admitting: Allergy

## 2021-07-15 ENCOUNTER — Other Ambulatory Visit (HOSPITAL_COMMUNITY): Payer: Self-pay

## 2021-07-15 VITALS — BP 110/70 | HR 127 | Temp 97.6°F | Resp 16 | Ht 60.0 in | Wt 162.8 lb

## 2021-07-15 DIAGNOSIS — H1013 Acute atopic conjunctivitis, bilateral: Secondary | ICD-10-CM | POA: Diagnosis not present

## 2021-07-15 DIAGNOSIS — J3089 Other allergic rhinitis: Secondary | ICD-10-CM

## 2021-07-15 MED ORDER — OLOPATADINE HCL 0.2 % OP SOLN
1.0000 [drp] | Freq: Every day | OPHTHALMIC | 3 refills | Status: DC | PRN
  Filled 2021-07-15: qty 2.5, 20d supply, fill #0

## 2021-07-15 MED ORDER — MONTELUKAST SODIUM 10 MG PO TABS
10.0000 mg | ORAL_TABLET | Freq: Every day | ORAL | 3 refills | Status: DC
  Filled 2021-07-15: qty 30, 30d supply, fill #0
  Filled 2022-02-20 – 2022-04-05 (×2): qty 30, 30d supply, fill #1
  Filled 2022-05-20: qty 30, 30d supply, fill #2

## 2021-07-15 MED ORDER — LEVOCETIRIZINE DIHYDROCHLORIDE 5 MG PO TABS
5.0000 mg | ORAL_TABLET | Freq: Every evening | ORAL | 3 refills | Status: DC
  Filled 2021-07-15: qty 30, 30d supply, fill #0
  Filled 2022-02-20 – 2022-04-05 (×2): qty 30, 30d supply, fill #1
  Filled 2022-05-20: qty 30, 30d supply, fill #2

## 2021-07-15 NOTE — Assessment & Plan Note (Addendum)
Rhinoconjunctivitis symptoms for the last few months especially around her pets.  Sometimes even breaks out in rashes. Tried Zyrtec, Claritin with minimal benefit.  No prior allergy evaluation. 2 dogs and 1 cat at home.  Today's skin testing showed: Positive to grass, ragweed, trees, mold. Negative to cat and dog.  Start environmental control measures as below.  Start Singulair (montelukast) 10mg  daily at night.  Cautioned that in some children/adults can experience behavioral changes including hyperactivity, agitation, depression, sleep disturbances and suicidal ideations. These side effects are rare, but if you notice them you should notify me and discontinue Singulair (montelukast). . Use olopatadine eye drops 0.2% once a day as needed for itchy/watery eyes.  Take Xyzal (levocetirizine) daily as needed. May take twice a day during allergy flares. May switch antihistamines every few months.  Consider allergy injections for long term control if above medications do not help the symptoms.  Will get bloodwork to cat and dog first as clinical history highly suggestive for cat/dog allergy.

## 2021-07-15 NOTE — Progress Notes (Signed)
New Patient Note  RE: Alexandra Horton MRN: [id number] DOB: 2001/07/25 Date of Office Visit: 07/15/2021  Consult requested by: Revonda Humphrey, NP Primary care provider: Lucky Cowboy, MD  Chief Complaint: Allergy Testing (Environmental allergy test. Dog and cat fur since July. After petting her dogs she touched her face and it broke out in small itchy hives/ bumps.)  History of Present Illness: I had the pleasure of seeing Tameko Halder for initial evaluation at the [facility] on 07/15/2021. She is a 20 y.o. female, who is referred here by Lucky Cowboy, MD for the evaluation of environmental allergies.  She reports symptoms of itchy/watery/swollen eyes, itchy skin, nasal congestion, sneezing. Symptoms have been going on for a few months. Other triggers include exposure to cats and dogs. Anosmia: no. Headache: has migraines. She has used zyrtec, Claritin with minimal improvement in symptoms. Sinus infections: no. Previous work up includes: none. Previous ENT evaluation: no. Previous sinus imaging: no. History of nasal polyps: no. Last eye exam: last week. History of reflux: take tums prn.  Assessment and Plan: Mikael is a 20 y.o. female with: Other allergic rhinitis Rhinoconjunctivitis symptoms for the last few months especially around her pets.  Sometimes even breaks out in rashes. Tried Zyrtec, Claritin with minimal benefit.  No prior allergy evaluation. 2 dogs and 1 cat at home. Today's skin testing showed: Positive to grass, ragweed, trees, mold. Negative to cat and dog. Start environmental control measures as below. Start Singulair (montelukast) 10mg  daily at night. Cautioned that in some children/adults can experience behavioral changes including hyperactivity, agitation, depression, sleep disturbances and suicidal ideations. These side effects are rare, but if you notice them you should notify me and discontinue Singulair (montelukast). Use olopatadine eye drops  0.2% once a day as needed for itchy/watery eyes. Take Xyzal (levocetirizine) daily as needed. May take twice a day during allergy flares. May switch antihistamines every few months. Consider allergy injections for long term control if above medications do not help the symptoms. Will get bloodwork to cat and dog first as clinical history highly suggestive for cat/dog allergy.   Allergic conjunctivitis of both eyes See assessment and plan as above.  Return in about 2 months (around 09/14/2021).  Meds ordered this encounter  Medications   Olopatadine HCl 0.2 % SOLN    Sig: Apply 1 drop to eye(s) daily as needed (itchy/watery eyes).    Dispense:  2.5 mL    Refill:  3   montelukast (SINGULAIR) 10 MG tablet    Sig: Take 1 tablet by mouth at bedtime.    Dispense:  30 tablet    Refill:  3   levocetirizine (XYZAL) 5 MG tablet    Sig: Take 1 tablet by mouth every evening.    Dispense:  30 tablet    Refill:  3    Lab Orders  No laboratory test(s) ordered today    Other allergy screening: Asthma: no Food allergy: no Lactose intolerance Medication allergy: no Hymenoptera allergy: no Urticaria: no Eczema:no History of recurrent infections suggestive of immunodeficency: no  Diagnostics: Skin Testing: Environmental allergy panel. Positive to grass, ragweed, trees, mold. Negative to cat and dog. Results discussed with patient/family.  Airborne Adult Perc - 07/15/21 1507     Time Antigen Placed 1507    Allergen Manufacturer 13/09/22    Location Back    Number of Test 59    Panel 1 Select    1. Control-Buffer 50% Glycerol Negative  2. Control-Histamine 1 mg/ml 2+    3. Albumin saline Negative    4. Bahia Negative    5. French Southern Territories Negative    6. Johnson Negative    7. Kentucky Blue Negative    8. Meadow Fescue Negative    9. Perennial Rye Negative    10. Sweet Vernal Negative    11. Timothy Negative    12. Cocklebur Negative    13. Burweed Marshelder Negative    14. Ragweed,  short Negative    15. Ragweed, Giant Negative    16. Plantain,  English Negative    17. Lamb's Quarters Negative    18. Sheep Sorrell Negative    19. Rough Pigweed Negative    20. Marsh Elder, Rough Negative    21. Mugwort, Common Negative    22. Ash mix Negative    23. Birch mix Negative    24. Beech American Negative    25. Box, Elder Negative    26. Cedar, red Negative    27. Cottonwood, Guinea-Bissau Negative    28. Elm mix Negative    29. Hickory Negative    30. Maple mix Negative    31. Oak, Guinea-[place]issau mix Negative    32. Pecan Pollen Negative    33. Pine mix Negative    34. Sycamore Eastern Negative    35. Walnut, Black Pollen Negative    36. Alternaria alternata Negative    37. Cladosporium Herbarum Negative    38. Aspergillus mix Negative    39. Penicillium mix Negative    40. Bipolaris sorokiniana (Helminthosporium) Negative    41. Drechslera spicifera (Curvularia) Negative    42. Mucor plumbeus Negative    43. Fusarium moniliforme Negative    44. Aureobasidium pullulans (pullulara) Negative    45. Rhizopus oryzae Negative    46. Botrytis cinera Negative    47. Epicoccum nigrum Negative    48. Phoma betae Negative    49. Candida Albicans Negative    50. Trichophyton mentagrophytes Negative    51. Mite, D Farinae  5,000 AU/ml Negative    52. Mite, D Pteronyssinus  5,000 AU/ml Negative    53. Cat Hair 10,000 BAU/ml Negative    54.  Dog Epithelia Negative    55. Mixed Feathers Negative    56. Horse Epithelia Negative    57. Cockroach, German Negative    58. Mouse Negative    59. Tobacco Leaf Negative             Intradermal - 07/15/21 1524     Time Antigen Placed 1524    Allergen Manufacturer Waynette Buttery    Location Arm    Number of Test 15    Intradermal Select    Control Negative    [place] Territ[place] Negative    Johnson Negative    7 Grass 3+    Ragweed mix 2+    Weed mix Negative    Tree mix 2+    Mold 1 Negative    Mold 2 Negative    Mold 3 Negative    Mold 4 2+     Cat Negative    Dog Negative    Cockroach Negative    Mite mix Negative             Past Medical History: Patient Active Problem List   Diagnosis Date Noted   Other allergic rhinitis 07/15/2021   Allergic conjunctivitis of both eyes 07/15/2021   Migraine without aura and without status migrainosus, not intractable 11/26/2020  Elevated LFTs 03/18/2020   Obesity (BMI 30.0-34.9) 03/17/2020   Depression with anxiety 03/08/2019   Acne 11/15/2017   H/O tics 01/03/2014   Auditory processing disorder 05/04/2013   ADD (attention deficit disorder) 07/05/2011   Past Medical History:  Diagnosis Date   ADHD (attention deficit hyperactivity disorder)    Dr. Verdie Mosher   Anemia    Past Surgical History: Past Surgical History:  Procedure Laterality Date   WISDOM TOOTH EXTRACTION  2019   Medication List:  Current Outpatient Medications  Medication Sig Dispense Refill   buPROPion (WELLBUTRIN XL) 150 MG 24 hr tablet TAKE 1 TABLET BY MOUTH DAILY FOR MOOD, FOCUS & CONCENTRATION 90 tablet 3   doxycycline (VIBRAMYCIN) 100 MG capsule Take 1 capsule by mouth twice daily with food. 60 capsule 2   levocetirizine (XYZAL) 5 MG tablet Take 1 tablet by mouth every evening. 30 tablet 3   methylphenidate (CONCERTA) 36 MG PO CR tablet Take 1 to 2 tablets by mouth once daily ONLY as needed for ADD /Focus.  (Do not use daily in order to avoid tolerance and  and addiction.) 60 tablet 0   montelukast (SINGULAIR) 10 MG tablet Take 1 tablet by mouth at bedtime. 30 tablet 3   Olopatadine HCl 0.2 % SOLN Apply 1 drop to eye(s) daily as needed (itchy/watery eyes). 2.5 mL 3   rizatriptan (MAXALT) 10 MG tablet TAKE 1 TABLET BY MOUTH ONCE AS NEEDED FOR MIGRAINE, MAY REPEAT IN 2 HOURS AS NEEDED 30 tablet 2   sertraline (ZOLOFT) 100 MG tablet Take 1 & 1/2 tablets (150 mg total) by mouth daily. 135 tablet 3   spironolactone (ALDACTONE) 50 MG tablet Take 1 tablet by mouth daily for 2 weeks and then increase to 1 tablet  by mouth  twice daily. 60 tablet 0   tretinoin (RETIN-A) 0.05 % cream Apply pea size amount to entire face at night as directed 45 g 3   cyclobenzaprine (FLEXERIL) 10 MG tablet Take 1 tablet (10 mg total) by mouth 3 (three) times daily as needed for muscle spasms. (Patient not taking: Reported on 04/30/2021) 30 tablet 0   No current facility-administered medications for this visit.   Allergies: Allergies  Allergen Reactions   Lactose Intolerance (Gi)    Social History: Social History   Socioeconomic History   Marital status: Single    Spouse name: Not on file   Number of children: Not on file   Years of education: Not on file   Highest education level: Not on file  Occupational History   Not on file  Tobacco Use   Smoking status: Never   Smokeless tobacco: Never  Vaping Use   Vaping Use: Some days  Substance and Sexual Activity   Alcohol use: No   Drug use: Never   Sexual activity: Not Currently    Partners: Male    Birth control/protection: None    Comment: intercourse age 33, less than 5 sexual partners  Other Topics Concern   Not on file  Social History Narrative   [employer]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   Social Determinants of Health   Financial Resource Strain: Not on file  Food Insecurity: Not on file  Transportation Needs: Not on file  Physical Activity: Not on file  Stress: Not on file  Social Connections: Not on file   Lives in a 20 year old house. Smoking: vapes - encouraged cessation. Occupation: CMA  Environmental History:  Water Damage/mildew in the house: no Carpet in the family room: no Carpet in the bedroom: yes Heating: gas Cooling: central Pet: yes 2 dogs x few years, 1 cat x few yrs  Family History: Family History  Problem Relation Age of Onset   Allergic rhinitis Mother    Allergic rhinitis Father    Hypertension Father    Migraines Father    ADD / ADHD Father    ADD / ADHD Brother    Diabetes type  I Brother    Migraines Maternal Aunt    Hypothyroidism Paternal Aunt    Asthma Maternal Grandmother    Migraines Maternal Grandmother    Autism Cousin        Maternal 1st Cousin   ADD / ADHD Cousin        Several Paternal 1st Cousins have ADHD   Review of Systems  Constitutional:  Negative for appetite change, chills, fever and unexpected weight change.  HENT:  Negative for congestion and rhinorrhea.   Eyes:  Positive for itching.  Respiratory:  Negative for cough, chest tightness, shortness of breath and wheezing.   Cardiovascular:  Negative for chest pain.  Gastrointestinal:  Negative for abdominal pain.  Genitourinary:  Negative for difficulty urinating.  Skin:  Positive for rash.  Allergic/Immunologic: Positive for environmental allergies.  Neurological:  Positive for headaches.   Objective: BP 110/70   Pulse (!) 127   Temp 97.6 F (36.4 C) (Temporal)   Resp 16   Ht 5' (1.524 m)   Wt 162 lb 12.8 oz (73.8 kg)   SpO2 97%   BMI 31.79 kg/m  Body mass index is 31.79 kg/m. Physical Exam Vitals and nursing note reviewed.  Constitutional:      Appearance: Normal appearance. She is well-developed.  HENT:     Head: Normocephalic and atraumatic.     Right Ear: Tympanic membrane and external ear normal.     Left Ear: Tympanic membrane and external ear normal.     Nose: Nose normal.     Mouth/Throat:     Mouth: Mucous membranes are moist.     Pharynx: Oropharynx is clear.  Eyes:     Conjunctiva/sclera: Conjunctivae normal.  Cardiovascular:     Rate and Rhythm: Normal rate and regular rhythm.     Heart sounds: Normal heart sounds. No murmur heard.   No friction rub. No gallop.  Pulmonary:     Effort: Pulmonary effort is normal.     Breath sounds: Normal breath sounds. No wheezing, rhonchi or rales.  Musculoskeletal:     Cervical back: Neck supple.  Skin:    General: Skin is warm.     Findings: No rash.  Neurological:     Mental Status: She is alert and oriented to  person, place, and time.  Psychiatric:        Behavior: Behavior normal.  The plan was reviewed with the patient/family, and all questions/concerned were addressed.  It was my pleasure to see Juliamarie today and participate in her care. Please feel free to contact me with any questions or concerns.  Sincerely,  Wyline Mood, DO Allergy & Immunology  [facility] office: [phone] [facility] office: [phone]

## 2021-07-15 NOTE — Patient Instructions (Addendum)
Today's skin testing showed: Positive to grass, ragweed, trees, mold. Negative to cat and dog. Results given.  Environmental allergies Start environmental control measures as below. Start Singulair (montelukast) 10mg  daily at night. Cautioned that in some children/adults can experience behavioral changes including hyperactivity, agitation, depression, sleep disturbances and suicidal ideations. These side effects are rare, but if you notice them you should notify me and discontinue Singulair (montelukast). Use olopatadine eye drops 0.2% once a day as needed for itchy/watery eyes.  Take Xyzal (levocetirizine) daily as needed. May take twice a day during allergy flares. May switch antihistamines every few months. Consider allergy injections for long term control if above medications do not help the symptoms. Will need to get bloodwork first.   Follow up in 2 months or sooner if needed.    Reducing Pollen Exposure Pollen seasons: trees (spring), grass (summer) and ragweed/weeds (fall). Keep windows closed in your home and car to lower pollen exposure.  Install air conditioning in the bedroom and throughout the house if possible.  Avoid going out in dry windy days - especially early morning. Pollen counts are highest between 5 - 10 AM and on dry, hot and windy days.  Save outside activities for late afternoon or after a heavy rain, when pollen levels are lower.  Avoid mowing of grass if you have grass pollen allergy. Be aware that pollen can also be transported indoors on people and pets.  Dry your clothes in an automatic dryer rather than hanging them outside where they might collect pollen.  Rinse hair and eyes before bedtime. Mold Control Mold and fungi can grow on a variety of surfaces provided certain temperature and moisture conditions exist.  Outdoor molds grow on plants, decaying vegetation and soil. The major outdoor mold, Alternaria and Cladosporium, are found in very high numbers  during hot and dry conditions. Generally, a late summer - fall peak is seen for common outdoor fungal spores. Rain will temporarily lower outdoor mold spore count, but counts rise rapidly when the rainy period ends. The most important indoor molds are Aspergillus and Penicillium. Dark, humid and poorly ventilated basements are ideal sites for mold growth. The next most common sites of mold growth are the bathroom and the kitchen. Outdoor (Seasonal) Mold Control Use air conditioning and keep windows closed. Avoid exposure to decaying vegetation. Avoid leaf raking. Avoid grain handling. Consider wearing a face mask if working in moldy areas.  Indoor (Perennial) Mold Control  Maintain humidity below 50%. Get rid of mold growth on hard surfaces with water, detergent and, if necessary, 5% bleach (do not mix with other cleaners). Then dry the area completely. If mold covers an area more than 10 square feet, consider hiring an indoor environmental professional. For clothing, washing with soap and water is best. If moldy items cannot be cleaned and dried, throw them away. Remove sources e.g. contaminated carpets. Repair and seal leaking roofs or pipes. Using dehumidifiers in damp basements may be helpful, but empty the water and clean units regularly to prevent mildew from forming. All rooms, especially basements, bathrooms and kitchens, require ventilation and cleaning to deter mold and mildew growth. Avoid carpeting on concrete or damp floors, and storing items in damp areas. Pet Allergen Avoidance: Contrary to popular opinion, there are no "hypoallergenic" breeds of dogs or cats. That is because people are not allergic to an animal's hair, but to an allergen found in the animal's saliva, dander (dead skin flakes) or urine. Pet allergy symptoms typically occur within minutes.  For some people, symptoms can build up and become most severe 8 to 12 hours after contact with the animal. People with severe  allergies can experience reactions in public places if dander has been transported on the pet owners' clothing. Keeping an animal outdoors is only a partial solution, since homes with pets in the yard still have higher concentrations of animal allergens. Before getting a pet, ask your allergist to determine if you are allergic to animals. If your pet is already considered part of your family, try to minimize contact and keep the pet out of the bedroom and other rooms where you spend a great deal of time. As with dust mites, vacuum carpets often or replace carpet with a hardwood floor, tile or linoleum. High-efficiency particulate air (HEPA) cleaners can reduce allergen levels over time. While dander and saliva are the source of cat and dog allergens, urine is the source of allergens from rabbits, hamsters, mice and [place] pigs; so ask a non-allergic family member to clean the animal's cage. If you have a pet allergy, talk to your allergist about the potential for allergy immunotherapy (allergy shots). This strategy can often provide long-term relief.

## 2021-07-15 NOTE — Assessment & Plan Note (Signed)
.   See assessment and plan as above. 

## 2021-07-28 ENCOUNTER — Ambulatory Visit: Admit: 2021-07-28 | Payer: No Typology Code available for payment source

## 2021-08-04 ENCOUNTER — Encounter: Payer: Self-pay | Admitting: Nurse Practitioner

## 2021-08-04 ENCOUNTER — Encounter: Payer: Self-pay | Admitting: Adult Health

## 2021-08-05 ENCOUNTER — Other Ambulatory Visit (HOSPITAL_COMMUNITY): Payer: Self-pay

## 2021-08-05 ENCOUNTER — Other Ambulatory Visit: Payer: Self-pay | Admitting: Nurse Practitioner

## 2021-08-05 ENCOUNTER — Encounter: Payer: Self-pay | Admitting: Nurse Practitioner

## 2021-08-05 DIAGNOSIS — N939 Abnormal uterine and vaginal bleeding, unspecified: Secondary | ICD-10-CM

## 2021-08-05 MED ORDER — MEGESTROL ACETATE 40 MG PO TABS
40.0000 mg | ORAL_TABLET | Freq: Every day | ORAL | 0 refills | Status: AC
Start: 2021-08-05 — End: 2021-08-15
  Filled 2021-08-05: qty 10, 10d supply, fill #0

## 2021-08-06 ENCOUNTER — Encounter: Payer: Self-pay | Admitting: Adult Health

## 2021-08-19 ENCOUNTER — Other Ambulatory Visit: Payer: Self-pay | Admitting: Internal Medicine

## 2021-08-19 ENCOUNTER — Other Ambulatory Visit (HOSPITAL_COMMUNITY): Payer: Self-pay

## 2021-08-19 DIAGNOSIS — F988 Other specified behavioral and emotional disorders with onset usually occurring in childhood and adolescence: Secondary | ICD-10-CM

## 2021-08-19 MED ORDER — METHYLPHENIDATE HCL ER (OSM) 36 MG PO TBCR
EXTENDED_RELEASE_TABLET | ORAL | 0 refills | Status: DC
  Filled 2021-08-19: qty 60, 30d supply, fill #0

## 2021-08-19 MED ORDER — SPIRONOLACTONE 50 MG PO TABS
50.0000 mg | ORAL_TABLET | Freq: Two times a day (BID) | ORAL | 4 refills | Status: DC
  Filled 2021-08-19: qty 60, 30d supply, fill #0

## 2021-08-19 MED FILL — Bupropion HCl Tab ER 24HR 150 MG: ORAL | 90 days supply | Qty: 90 | Fill #2 | Status: AC

## 2021-08-20 NOTE — Progress Notes (Signed)
FOLLOW UP  Assessment and Plan:   Alexandra Horton was seen today for follow-up and medication management.  Diagnoses and all orders for this visit:  Attention deficit disorder (ADD) without hyperactivity/ Auditory processing disorder Continue with currently plan, discussed setting up routine in daily life to help focus and remember. Discussed lifestyle and diet as relevant to ADD.  Will be deferring further med changes to psych which she plans to initiate The patient was counseled on the addictive nature of the medication and was encouraged to take drug holidays when not needed.   Depression/ Anxiety Remains severely depressed on wellbutrin 150 mg, sertraline 150 mg, has failed lexapro, also on concerta Labile mood, ? Bipolar  She is interested in pharmacygenomic testing She would benefit from psychiatry evaluation at this point Discussed she will need to initiate this by checking local providers with insurance Contact office if difficulty scheduling Encouraged to follow up with her new therapist Stress management techniques discussed, increase water, good sleep hygiene discussed, increase exercise, and increase veggies.  Follow up 3 months or sooner if needed  Obesity - BMI 30 Losing weight but not healthy manner -  Will focus on stress management, get in with psych GI workup as below Continue to follow up closely  Neck pain/back pain With normal neuro, get xrays and plan PT referral per patient preference Will not do NSAIDs due to ongoing GI concerns and concern possible gastritis  Epigastric pain with tenderness, nausea, poor appetite, intermittent diarrhea With intermittent diarrhea sx doubt infectious -  Check CBC, CMP/GFR, lipase, calprotectin Did have [place] in 2021 showing no gallstones If labs negative trial of 2-4 weeks omeprazole but if not sig improved recommend GI evaluation Do note concurrent with severe depression, may be related to this, have encouraged psych/therapy however  will continue to work up from medical perspective  Follow up sooner if new/concerning changes  Need for tetanus booster - Td administered without complication.    Continue diet and meds as discussed. Further disposition pending results of labs. Discussed med's effects and SE's.   Over 30 minutes of exam, counseling, chart review, and critical decision making was performed.   Future Appointments  Date Time Provider Department Center  08/27/2021 12:00 PM Wyline Beady A, NP [facility] None  09/24/2021 12:00 PM Wyline Beady A, NP [facility] None  10/12/2021  4:00 PM Ellamae Sia, DO [facility] None  04/30/2022 10:00 AM Revonda Humphrey, NP [facility] None    ----------------------------------------------------------------------------------------------------------------------  HPI BP 112/80    Pulse 86    Temp (!) 97.5 F (36.4 C)    Wt 155 lb (70.3 kg)    SpO2 99%    BMI 30.27 kg/m   20 y.o. female  Presents for 3 month follow up on ADD, depression/anxiety and acne.   She is CMA/phlebotomist, working at her [employer], trying find a new job. 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   She has been on wellbutrin 150 mg daily for many years, concerta 36 mg BID on school days since a teen for ADD. She notes recently that the medication is not helping as much. More recently with college/pandemic with depression, failed lexapro, more recently on sertraline 150 mg mg daily for depression/anxiety with some initial benefit, but reports severe depressed mood, angry, lashing out. She is newly working with a therapist and likes her but having trouble getting on her schedule. Newly identifying as lesbian, has girlfriend, but having some relationship  stress, currently fighting. She is interested in psych pharmacogenomic testing.    Denies SI/HI. Alcohol, drugs.   Mood is very poor, also reports last 2-3 months having nausea, severe reduced appetite, not  eating much, never full meal. Denies reflux, has tried pepto bismol and tums without preceived benefit. Also having intermittent diarrhea (liquid) without blood, mucus, greasy. Denis recent travel. Had abd [place] in 05/20/2020 that showed:   1. Technically challenging examination due to shadowing bowel gas. 2. Hepatic parenchymal echogenicity may be mildly increased with some inhomogeneity of the hepatic echotexture. Could reflect intrinsic liver disease or fatty infiltration. No focal liver lesions are seen. 3. Otherwise unremarkable abdominal ultrasound.  She also reports 3 months of having tightness in neck muscles, cracking, more headaches; also lumbar pain without radicular sx. She is requesting PT referral. Hasn't had imaging. Did hear a crack in lower back after she picked up girlfriend 3 months ago and persistent since then.   BMI is Body mass index is 30.27 kg/m. She gained weight since pandemic. Very poor appetite, eating minimally last few months due to GI sx, weight down 7 lb in last 6 weeks.  Wt Readings from Last 3 Encounters:  08/25/21 155 lb (70.3 kg)  07/15/21 162 lb 12.8 oz (73.8 kg)  04/30/21 171 lb 6.4 oz (77.7 kg)   Today their BP is BP: 112/80  She does not workout. She denies chest pain, shortness of breath, dizziness.  Patient is on Vitamin D supplement.   Lab Results  Component Value Date   VD25OH 18 (L) 04/30/2021     Lab Results  Component Value Date   ALT 24 04/30/2021   AST 18 04/30/2021   BILITOT 0.3 04/30/2021     Current Medications:  Current Outpatient Medications on File Prior to Visit  Medication Sig   buPROPion (WELLBUTRIN XL) 150 MG 24 hr tablet TAKE 1 TABLET BY MOUTH DAILY FOR MOOD, FOCUS & CONCENTRATION   doxycycline (VIBRAMYCIN) 100 MG capsule Take 1 capsule by mouth twice daily with food.   levocetirizine (XYZAL) 5 MG tablet Take 1 tablet by mouth every evening.   methylphenidate (CONCERTA) 36 MG PO CR tablet Take 1 to 2 tablets by mouth  once daily ONLY as needed for ADD /Focus.  (Do not use daily in order to avoid tolerance and  and addiction.)   montelukast (SINGULAIR) 10 MG tablet Take 1 tablet by mouth at bedtime.   Olopatadine HCl 0.2 % SOLN Apply 1 drop to eye(s) daily as needed (itchy/watery eyes).   rizatriptan (MAXALT) 10 MG tablet TAKE 1 TABLET BY MOUTH ONCE AS NEEDED FOR MIGRAINE, MAY REPEAT IN 2 HOURS AS NEEDED   sertraline (ZOLOFT) 100 MG tablet Take 1 & 1/2 tablets (150 mg total) by mouth daily.   spironolactone (ALDACTONE) 50 MG tablet Take 1 tablet by mouth daily for 2 weeks and then increase to 1 tablet by mouth  twice daily.   spironolactone (ALDACTONE) 50 MG tablet Take 1 tablet (50 mg total) by mouth 2 (two) times daily.   tretinoin (RETIN-A) 0.05 % cream Apply pea size amount to entire face at night as directed   [DISCONTINUED] escitalopram (LEXAPRO) 20 MG tablet Take      1 tablet      Daily       for Mood   No current facility-administered medications on file prior to visit.     Allergies:  Allergies  Allergen Reactions   Lactose Intolerance (Gi)  Medical History:  Past Medical History:  Diagnosis Date   ADHD (attention deficit hyperactivity disorder)    Dr. Verdie Mosher   Anemia    Family history- Reviewed and unchanged Social history- Reviewed and unchanged   Review of Systems:  Review of Systems  Constitutional:  Negative for malaise/fatigue and weight loss.  HENT:  Negative for hearing loss and tinnitus.   Eyes:  Negative for blurred vision and double vision.  Respiratory:  Negative for cough, shortness of breath and wheezing.   Cardiovascular:  Negative for chest pain, palpitations, orthopnea, claudication and leg swelling.  Gastrointestinal:  Positive for diarrhea and nausea. Negative for abdominal pain, blood in stool, constipation, heartburn, melena and vomiting.  Genitourinary: Negative.   Musculoskeletal:  Positive for back pain and neck pain. Negative for joint pain and myalgias.   Skin:  Negative for rash.  Neurological:  Negative for dizziness, tingling, tremors, sensory change, focal weakness, weakness and headaches.  Endo/Heme/Allergies:  Negative for polydipsia.  Psychiatric/Behavioral:  Positive for depression. Negative for substance abuse. The patient is nervous/anxious. The patient does not have insomnia.   All other systems reviewed and are negative.  Physical Exam: BP 112/80    Pulse 86    Temp (!) 97.5 F (36.4 C)    Wt 155 lb (70.3 kg)    SpO2 99%    BMI 30.27 kg/m  Wt Readings from Last 3 Encounters:  08/25/21 155 lb (70.3 kg)  07/15/21 162 lb 12.8 oz (73.8 kg)  04/30/21 171 lb 6.4 oz (77.7 kg)   General Appearance: Well nourished, obese, in no apparent distress. Eyes: PERRLA, conjunctiva no swelling or erythema ENT/Mouth: Ext aud canals clear on left, right obstructed by soft wax, TMs without erythema, bulging. No erythema, swelling, or exudate on post pharynx.  Tonsils not swollen or erythematous. Hearing normal.  Neck: Supple, thyroid normal.  Respiratory: Respiratory effort normal, BS equal bilaterally without rales, rhonchi, wheezing or stridor.  Cardio: RRR with no MRGs. Brisk peripheral pulses without edema.  Abdomen: Soft, + BS.  Non tender, no guarding, rebound, hernias, masses. Lymphatics: Non tender without lymphadenopathy.  Musculoskeletal: Full ROM, 5/5 strength, Normal gait Skin: Warm, dry without rashes,  ecchymosis. Face demonstrates multiple pustules in varying healing progress with some pitting scarring to cheeks and chin.  Neuro: Cranial nerves intact. No cerebellar symptoms.  Psych: Awake and oriented X 3, depressed affect, good eye contact, non-pressured speech, Insight and Judgment appropriate. Frequent head ticks.   Dan Maker, NP 10:11 AM [facility]

## 2021-08-24 NOTE — Telephone Encounter (Signed)
Appointments patient would like to schedule visit.

## 2021-08-25 ENCOUNTER — Ambulatory Visit
Admission: RE | Admit: 2021-08-25 | Discharge: 2021-08-25 | Disposition: A | Discharge status: Home / Self Care | Payer: No Typology Code available for payment source | Source: Ambulatory Visit | Attending: Adult Health | Admitting: Adult Health

## 2021-08-25 ENCOUNTER — Other Ambulatory Visit: Payer: Self-pay

## 2021-08-25 ENCOUNTER — Ambulatory Visit (INDEPENDENT_AMBULATORY_CARE_PROVIDER_SITE_OTHER): Payer: No Typology Code available for payment source | Admitting: Adult Health

## 2021-08-25 ENCOUNTER — Encounter: Payer: Self-pay | Admitting: Adult Health

## 2021-08-25 ENCOUNTER — Other Ambulatory Visit (HOSPITAL_COMMUNITY): Payer: Self-pay

## 2021-08-25 VITALS — BP 112/80 | HR 86 | Temp 97.5°F | Wt 155.0 lb

## 2021-08-25 DIAGNOSIS — F418 Other specified anxiety disorders: Secondary | ICD-10-CM

## 2021-08-25 DIAGNOSIS — R634 Abnormal weight loss: Secondary | ICD-10-CM

## 2021-08-25 DIAGNOSIS — R7989 Other specified abnormal findings of blood chemistry: Secondary | ICD-10-CM | POA: Diagnosis not present

## 2021-08-25 DIAGNOSIS — E669 Obesity, unspecified: Secondary | ICD-10-CM | POA: Diagnosis not present

## 2021-08-25 DIAGNOSIS — R197 Diarrhea, unspecified: Secondary | ICD-10-CM | POA: Diagnosis not present

## 2021-08-25 DIAGNOSIS — E66811 Obesity, class 1: Secondary | ICD-10-CM

## 2021-08-25 DIAGNOSIS — R630 Anorexia: Secondary | ICD-10-CM

## 2021-08-25 DIAGNOSIS — Z23 Encounter for immunization: Secondary | ICD-10-CM

## 2021-08-25 DIAGNOSIS — M545 Low back pain, unspecified: Secondary | ICD-10-CM

## 2021-08-25 DIAGNOSIS — F988 Other specified behavioral and emotional disorders with onset usually occurring in childhood and adolescence: Secondary | ICD-10-CM | POA: Diagnosis not present

## 2021-08-25 DIAGNOSIS — R10816 Epigastric abdominal tenderness: Secondary | ICD-10-CM

## 2021-08-25 DIAGNOSIS — G43009 Migraine without aura, not intractable, without status migrainosus: Secondary | ICD-10-CM

## 2021-08-25 DIAGNOSIS — M542 Cervicalgia: Secondary | ICD-10-CM

## 2021-08-25 MED ORDER — OMEPRAZOLE 20 MG PO CPDR
20.0000 mg | DELAYED_RELEASE_CAPSULE | Freq: Every day | ORAL | 0 refills | Status: DC
  Filled 2021-08-25: qty 30, 30d supply, fill #0

## 2021-08-26 ENCOUNTER — Other Ambulatory Visit: Payer: Self-pay | Admitting: Adult Health

## 2021-08-26 ENCOUNTER — Other Ambulatory Visit (HOSPITAL_COMMUNITY): Payer: Self-pay

## 2021-08-26 DIAGNOSIS — M542 Cervicalgia: Secondary | ICD-10-CM

## 2021-08-26 DIAGNOSIS — M545 Low back pain, unspecified: Secondary | ICD-10-CM

## 2021-08-26 LAB — COMPLETE METABOLIC PANEL WITH GFR
AG Ratio: 1.7 (calc) (ref 1.0–2.5)
ALT: 20 U/L (ref 6–29)
AST: 17 U/L (ref 10–30)
Albumin: 4.7 g/dL (ref 3.6–5.1)
Alkaline phosphatase (APISO): 61 U/L (ref 31–125)
BUN: 14 mg/dL (ref 7–25)
CO2: 25 mmol/L (ref 20–32)
Calcium: 10.6 mg/dL — ABNORMAL HIGH (ref 8.6–10.2)
Chloride: 107 mmol/L (ref 98–110)
Creat: 0.83 mg/dL (ref 0.50–0.96)
Globulin: 2.7 g/dL (calc) (ref 1.9–3.7)
Glucose, Bld: 89 mg/dL (ref 65–99)
Potassium: 4 mmol/L (ref 3.5–5.3)
Sodium: 141 mmol/L (ref 135–146)
Total Bilirubin: 0.4 mg/dL (ref 0.2–1.2)
Total Protein: 7.4 g/dL (ref 6.1–8.1)
eGFR: 103 mL/min/{1.73_m2} (ref >=60)

## 2021-08-26 LAB — CBC WITH DIFFERENTIAL/PLATELET
Absolute Monocytes: 878 cells/uL (ref 200–950)
Basophils Absolute: 53 cells/uL (ref 0–200)
Basophils Relative: 0.8 %
Eosinophils Absolute: 112 cells/uL (ref 15–500)
Eosinophils Relative: 1.7 %
HCT: 39.7 % (ref 35.0–45.0)
Hemoglobin: 13 g/dL (ref 11.7–15.5)
Lymphs Abs: 2554 cells/uL (ref 850–3900)
MCH: 27.3 pg (ref 27.0–33.0)
MCHC: 32.7 g/dL (ref 32.0–36.0)
MCV: 83.2 fL (ref 80.0–100.0)
MPV: 10.8 fL (ref 7.5–12.5)
Monocytes Relative: 13.3 %
Neutro Abs: 3003 cells/uL (ref 1500–7800)
Neutrophils Relative %: 45.5 %
Platelets: 345 10*3/uL (ref 140–400)
RBC: 4.77 10*6/uL (ref 3.80–5.10)
RDW: 13.5 % (ref 11.0–15.0)
Total Lymphocyte: 38.7 %
WBC: 6.6 10*3/uL (ref 3.8–10.8)

## 2021-08-26 LAB — TSH: TSH: 0.79 mIU/L

## 2021-08-26 LAB — LIPASE: Lipase: 19 U/L (ref 7–60)

## 2021-08-26 MED ORDER — ONDANSETRON HCL 8 MG PO TABS
ORAL_TABLET | ORAL | 1 refills | Status: DC
  Filled 2021-08-26: qty 60, 20d supply, fill #0
  Filled 2021-10-07: qty 60, 20d supply, fill #1

## 2021-08-26 MED ORDER — HYDROXYZINE HCL 25 MG PO TABS
ORAL_TABLET | ORAL | 0 refills | Status: DC
  Filled 2021-08-26: qty 45, 15d supply, fill #0

## 2021-08-26 MED ORDER — TRAZODONE HCL 50 MG PO TABS
ORAL_TABLET | ORAL | 0 refills | Status: DC
  Filled 2021-08-26: qty 15, 15d supply, fill #0

## 2021-08-27 ENCOUNTER — Encounter: Payer: Self-pay | Admitting: Adult Health

## 2021-08-27 ENCOUNTER — Ambulatory Visit (INDEPENDENT_AMBULATORY_CARE_PROVIDER_SITE_OTHER): Payer: No Typology Code available for payment source | Admitting: Nurse Practitioner

## 2021-08-27 ENCOUNTER — Other Ambulatory Visit: Payer: Self-pay

## 2021-08-27 ENCOUNTER — Encounter: Payer: Self-pay | Admitting: Nurse Practitioner

## 2021-08-27 VITALS — BP 134/74

## 2021-08-27 DIAGNOSIS — N921 Excessive and frequent menstruation with irregular cycle: Secondary | ICD-10-CM

## 2021-08-27 DIAGNOSIS — Z30432 Encounter for removal of intrauterine contraceptive device: Secondary | ICD-10-CM

## 2021-08-27 DIAGNOSIS — Z975 Presence of (intrauterine) contraceptive device: Secondary | ICD-10-CM

## 2021-08-27 NOTE — Progress Notes (Signed)
° °  Acute Office Visit  Subjective:    Patient ID: Alexandra Horton, female    DOB: 01/22/2001, 20 y.o.   MRN: [id number]   HPI 20 y.o. presents today to discuss bleeding with IUD. She has had occasional bleeding with Mirena but the last 2 weeks she has had bleeding most days ranging from light to moderate. She took Megace as prescribed and bleeding stopped but returned shortly after completing course. Not bleeding today. She has also had severe cramping with negative workup earlier this year. She still experiences cramping. She has been considering removing IUD as she is now with female partners and wants removed today.    Review of Systems  Constitutional: Negative.   Genitourinary:  Positive for menstrual problem.      Objective:    Physical Exam Constitutional:      Appearance: Normal appearance.  Genitourinary:    General: Normal vulva.     Vagina: Normal.     Cervix: Normal.     Uterus: Normal.      Comments: IUD string visible about 2.5 cm, grasped with rings forceps and removed with ease   BP 134/74  Wt Readings from Last 3 Encounters:  08/25/21 155 lb (70.3 kg)  07/15/21 162 lb 12.8 oz (73.8 kg)  04/30/21 171 lb 6.4 oz (77.7 kg)        Assessment & Plan:   Problem List Items Addressed This Visit   None Visit Diagnoses     Encounter for IUD removal    -  Primary   Breakthrough bleeding with IUD          Plan: Discussed normal bleeding patterns with IUD but she wants removed today. IUD removed with ease, minimal bleeding present. She does not desire contraception at this time. Will return as needed.      Olivia Mackie DNP, 12:30 PM 08/27/2021

## 2021-09-01 ENCOUNTER — Encounter: Payer: Self-pay | Admitting: Adult Health

## 2021-09-02 ENCOUNTER — Other Ambulatory Visit (HOSPITAL_COMMUNITY): Payer: Self-pay

## 2021-09-02 MED ORDER — HYDROXYZINE HCL 50 MG PO TABS
ORAL_TABLET | ORAL | 0 refills | Status: DC
Start: 2021-09-02 — End: 2021-10-07
  Filled 2021-09-02 – 2021-09-17 (×3): qty 45, 15d supply, fill #0

## 2021-09-10 ENCOUNTER — Other Ambulatory Visit (HOSPITAL_COMMUNITY): Payer: Self-pay

## 2021-09-16 ENCOUNTER — Other Ambulatory Visit (HOSPITAL_COMMUNITY): Payer: Self-pay

## 2021-09-16 MED ORDER — ATOMOXETINE HCL 40 MG PO CAPS
ORAL_CAPSULE | ORAL | 0 refills | Status: DC
  Filled 2021-09-16: qty 15, 15d supply, fill #0

## 2021-09-16 MED ORDER — VILAZODONE HCL 10 MG PO TABS
ORAL_TABLET | ORAL | 0 refills | Status: DC
  Filled 2021-09-16: qty 7, 7d supply, fill #0

## 2021-09-16 MED ORDER — TRAZODONE HCL 50 MG PO TABS
ORAL_TABLET | ORAL | 0 refills | Status: DC
  Filled 2021-09-16: qty 45, 30d supply, fill #0

## 2021-09-16 MED ORDER — METHYLPHENIDATE HCL ER (OSM) 36 MG PO TBCR
72.0000 mg | EXTENDED_RELEASE_TABLET | Freq: Every day | ORAL | 0 refills | Status: DC
  Filled 2021-09-16: qty 60, 30d supply, fill #0

## 2021-09-16 MED ORDER — SERTRALINE HCL 25 MG PO TABS
ORAL_TABLET | ORAL | 0 refills | Status: DC
  Filled 2021-09-16: qty 7, 7d supply, fill #0

## 2021-09-17 ENCOUNTER — Other Ambulatory Visit (HOSPITAL_COMMUNITY): Payer: Self-pay

## 2021-09-17 MED ORDER — SPIRONOLACTONE 50 MG PO TABS
ORAL_TABLET | ORAL | 2 refills | Status: DC
  Filled 2021-09-17: qty 60, 30d supply, fill #0
  Filled 2022-04-05: qty 60, 30d supply, fill #1
  Filled 2022-05-20 – 2022-08-18 (×2): qty 60, 30d supply, fill #2

## 2021-09-17 MED ORDER — TRETINOIN 0.05 % EX CREA
TOPICAL_CREAM | CUTANEOUS | 3 refills | Status: DC
  Filled 2021-09-17: qty 45, 30d supply, fill #0
  Filled 2022-04-05: qty 45, 30d supply, fill #1
  Filled 2022-05-20: qty 45, 30d supply, fill #2

## 2021-09-17 MED ORDER — VILAZODONE HCL 20 MG PO TABS
ORAL_TABLET | ORAL | 0 refills | Status: DC
  Filled 2021-09-17: qty 15, 15d supply, fill #0

## 2021-09-17 MED ORDER — DOXYCYCLINE HYCLATE 100 MG PO CAPS
ORAL_CAPSULE | ORAL | 2 refills | Status: DC
  Filled 2021-09-17: qty 60, 30d supply, fill #0
  Filled 2021-12-30: qty 60, 30d supply, fill #1
  Filled 2022-04-05: qty 60, 30d supply, fill #2

## 2021-09-17 MED ORDER — VILAZODONE HCL 10 MG PO TABS
ORAL_TABLET | ORAL | 0 refills | Status: DC
Start: 2021-09-17 — End: 2022-02-04
  Filled 2021-09-17 – 2021-10-07 (×2): qty 15, 15d supply, fill #0

## 2021-09-18 ENCOUNTER — Other Ambulatory Visit (HOSPITAL_COMMUNITY): Payer: Self-pay

## 2021-09-22 ENCOUNTER — Other Ambulatory Visit: Payer: Self-pay | Admitting: Adult Health

## 2021-09-22 ENCOUNTER — Encounter: Payer: Self-pay | Admitting: Nurse Practitioner

## 2021-09-22 ENCOUNTER — Other Ambulatory Visit (HOSPITAL_COMMUNITY): Payer: Self-pay

## 2021-09-22 DIAGNOSIS — G43009 Migraine without aura, not intractable, without status migrainosus: Secondary | ICD-10-CM

## 2021-09-22 MED ORDER — RIZATRIPTAN BENZOATE 10 MG PO TABS
ORAL_TABLET | ORAL | 2 refills | Status: DC
  Filled 2021-09-22: qty 18, 30d supply, fill #0
  Filled 2021-10-07: qty 30, 90d supply, fill #0
  Filled 2021-12-22: qty 30, 90d supply, fill #1
  Filled 2022-04-05: qty 30, 90d supply, fill #2

## 2021-09-22 NOTE — Telephone Encounter (Signed)
AEX scheduled 09/22/21.

## 2021-09-24 ENCOUNTER — Ambulatory Visit (INDEPENDENT_AMBULATORY_CARE_PROVIDER_SITE_OTHER): Payer: No Typology Code available for payment source | Admitting: Nurse Practitioner

## 2021-09-24 ENCOUNTER — Encounter: Payer: Self-pay | Admitting: Nurse Practitioner

## 2021-09-24 ENCOUNTER — Other Ambulatory Visit: Payer: Self-pay

## 2021-09-24 VITALS — BP 116/78 | Ht 60.5 in | Wt 155.0 lb

## 2021-09-24 DIAGNOSIS — Z01419 Encounter for gynecological examination (general) (routine) without abnormal findings: Secondary | ICD-10-CM

## 2021-09-24 DIAGNOSIS — Z113 Encounter for screening for infections with a predominantly sexual mode of transmission: Secondary | ICD-10-CM

## 2021-09-24 NOTE — Progress Notes (Signed)
° °  Alexandra Horton [birth year] [id number]   History:  21 y.o. G0 presents for annual exam without GYN complaints. IUD removed 08/27/2021 after experiencing breakthrough bleeding and frequent cramping. She is on her menses today. Recently sexually active with same sex partner. Has not received Gardasil and is not interested at this time. Wants STD screening, declines HIV, RPR.   Gynecologic History Patient's last menstrual period was 09/20/2021. Period Cycle (Days):  (On 1st period since IUD removal) Menstrual Flow: Heavy Dysmenorrhea: (!) Moderate Dysmenorrhea Symptoms: Cramping Contraception/Family planning: none Sexually active: Yes  Health Maintenance Last Pap: Not indicated Last mammogram: Not indicated Last colonoscopy: Not indicated  Last Dexa: Not indicated  Past medical history, past surgical history, family history and social history were all reviewed and documented in the EPIC chart. 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  ROS:  A ROS was performed and pertinent positives and negatives are included.  Exam:  Vitals:   09/24/21 1435  BP: 116/78  Weight: 155 lb (70.3 kg)  Height: 5' 0.5" (1.537 m)    Body mass index is 29.77 kg/m.  General appearance:  Normal Thyroid:  Symmetrical, normal in size, without palpable masses or nodularity. Respiratory  Auscultation:  Clear without wheezing or rhonchi Cardiovascular  Auscultation:  Regular rate, without rubs, murmurs or gallops  Edema/varicosities:  Not grossly evident Abdominal  Soft,nontender, without masses, guarding or rebound.  Liver/spleen:  No organomegaly noted  Hernia:  None appreciated  Skin  Inspection:  Grossly normal   Breasts: Not indicated per guidelines .annGenitourinary   Inguinal/mons:  Normal without inguinal adenopathy  External genitalia:  Normal appearing vulva with no masses, tenderness, or lesions  BUS/Urethra/Skene's glands:  Normal  Vagina:  Normal appearing  with normal color and discharge, no lesions  Cervix:  Normal appearing without discharge or lesions  Uterus:  Normal in size, shape and contour.  Midline and mobile, nontender  Adnexa/parametria:     Rt: Normal in size, without masses or tenderness.   Lt: Normal in size, without masses or tenderness.  Anus and perineum: Normal  Patient informed chaperone available to be present for breast and pelvic exam. Patient has requested no chaperone to be present. Patient has been advised what will be completed during breast and pelvic exam.    Assessment/Plan:  21 y.o. G0 for annual exam.   Well female exam with routine gynecological exam - Education provided on SBEs, importance of preventative screenings, current guidelines, high calcium diet, regular exercise, safe sex, and multivitamin daily. Labs done elsewhere.   Screen for STD (sexually transmitted disease) - Plan: SURESWAB CT/NG/T. Vaginalis. Declines HIV, RPR.   Follow up in 1 year for annual.     Olivia Mackie [facility], 2:52 PM 09/24/2021

## 2021-09-25 ENCOUNTER — Ambulatory Visit: Payer: Self-pay | Admitting: Physical Therapy

## 2021-09-25 ENCOUNTER — Encounter: Payer: Self-pay | Admitting: Adult Health

## 2021-09-25 LAB — SURESWAB CT/NG/T. VAGINALIS
C. trachomatis RNA, TMA: NOT DETECTED
N. gonorrhoeae RNA, TMA: NOT DETECTED
Trichomonas vaginalis RNA: NOT DETECTED

## 2021-09-30 ENCOUNTER — Other Ambulatory Visit (HOSPITAL_COMMUNITY): Payer: Self-pay

## 2021-09-30 MED ORDER — VILAZODONE HCL 20 MG PO TABS
20.0000 mg | ORAL_TABLET | Freq: Every day | ORAL | 0 refills | Status: DC
  Filled 2021-09-30: qty 15, 15d supply, fill #0

## 2021-09-30 MED ORDER — ATOMOXETINE HCL 40 MG PO CAPS
40.0000 mg | ORAL_CAPSULE | Freq: Every day | ORAL | 0 refills | Status: DC
  Filled 2021-09-30: qty 30, 30d supply, fill #0

## 2021-10-07 ENCOUNTER — Other Ambulatory Visit (HOSPITAL_COMMUNITY): Payer: Self-pay

## 2021-10-07 ENCOUNTER — Other Ambulatory Visit: Payer: Self-pay | Admitting: Adult Health

## 2021-10-07 DIAGNOSIS — R10816 Epigastric abdominal tenderness: Secondary | ICD-10-CM

## 2021-10-07 MED ORDER — OMEPRAZOLE 20 MG PO CPDR
20.0000 mg | DELAYED_RELEASE_CAPSULE | Freq: Every day | ORAL | 0 refills | Status: DC
  Filled 2021-10-07: qty 30, 30d supply, fill #0

## 2021-10-07 MED ORDER — HYDROXYZINE HCL 50 MG PO TABS
ORAL_TABLET | ORAL | 0 refills | Status: DC
  Filled 2021-10-07: qty 45, 15d supply, fill #0

## 2021-10-08 ENCOUNTER — Other Ambulatory Visit (HOSPITAL_COMMUNITY): Payer: Self-pay

## 2021-10-09 ENCOUNTER — Ambulatory Visit: Payer: Self-pay | Admitting: Physical Therapy

## 2021-10-09 NOTE — Therapy (Incomplete)
OUTPATIENT PHYSICAL THERAPY THORACOLUMBAR EVALUATION   Patient Name: Alexandra Horton MRN: [id number] DOB:[birth year], 21 y.o., female Today's Date: 10/09/2021    Past Medical History:  Diagnosis Date   ADHD (attention deficit hyperactivity disorder)    Dr. Loura Back   Anemia    Past Surgical History:  Procedure Laterality Date   WISDOM TOOTH EXTRACTION  2019   Patient Active Problem List   Diagnosis Date Noted   Diarrhea 08/25/2021   Other allergic rhinitis 07/15/2021   Allergic conjunctivitis of both eyes 07/15/2021   Migraine without aura and without status migrainosus, not intractable 11/26/2020   Elevated LFTs 03/18/2020   Obesity (BMI 30.0-34.9) 03/17/2020   Depression with anxiety 03/08/2019   Acne 11/15/2017   H/O tics 01/03/2014   Auditory processing disorder 05/04/2013   ADD (attention deficit disorder) 07/05/2011    PCP: Unk Pinto, MD  REFERRING PROVIDER: Liane Comber, NP  REFERRING DIAG: M54.2 (ICD-10-CM) - Neck pain M54.50 (ICD-10-CM) - Acute midline low back pain without sciatica   THERAPY DIAG:  No diagnosis found.  ONSET DATE: ***  SUBJECTIVE:                                                                                                                                                                                           SUBJECTIVE STATEMENT: *** PERTINENT HISTORY:  Epigastric pain with tenderness, nausea, poor appetite, intermittent diarrhea   Obesity - BMI 30   Depression/ Anxiety   PAIN:  Are you having pain? Yes NPRS scale: ***/10 Pain location: *** Pain orientation: {Pain Orientation:25161}  PAIN TYPE: {type:313116} Pain description: {PAIN DESCRIPTION:21022940}  Aggravating factors: *** Relieving factors: ***  PRECAUTIONS: None  WEIGHT BEARING RESTRICTIONS No  FALLS:  Has patient fallen in last 6 months? {yes/no:20286}, Number of falls: ***  LIVING ENVIRONMENT: Lives with: {[facility] lives with:25569::"lives with their  family"} Lives in: {Lives in:25570} Stairs: {yes/no:20286}; {Stairs:24000} Has following equipment at home: {Assistive devices:23999}  OCCUPATION: ***  PLOF: {PLOF:24004}  PATIENT GOALS ***   OBJECTIVE:   DIAGNOSTIC FINDINGS:  ***  PATIENT SURVEYS:  {rehab surveys:24030}  SCREENING FOR RED FLAGS: Bowel or bladder incontinence: {Yes/No:304960894} Spinal tumors: {Yes/No:304960894} Cauda equina syndrome: {Yes/No:304960894} Compression fracture: {Yes/No:304960894} Abdominal aneurysm: {Yes/No:304960894}  COGNITION:  Overall cognitive status: {cognition:24006}     SENSATION:  Light touch: {intact/deficits:24005}  Stereognosis: {intact/deficits:24005}  Hot/Cold: {intact/deficits:24005}  Proprioception: {intact/deficits:24005}  MUSCLE LENGTH: Hamstrings: Right *** deg; Left *** deg Thomas test: Right *** deg; Left *** deg  POSTURE:  ***  PALPATION: ***  LUMBARAROM/PROM  A/PROM A/PROM  10/09/2021  Flexion   Extension   Right lateral flexion   Left lateral  flexion   Right rotation   Left rotation    (Blank rows = not tested)  LE AROM/PROM:  A/PROM Right 10/09/2021 Left 10/09/2021  Hip flexion    Hip extension    Hip abduction    Hip adduction    Hip internal rotation    Hip external rotation    Knee flexion    Knee extension    Ankle dorsiflexion    Ankle plantarflexion    Ankle inversion    Ankle eversion     (Blank rows = not tested)  LE MMT:  MMT Right 10/09/2021 Left 10/09/2021  Hip flexion    Hip extension    Hip abduction    Hip adduction    Hip internal rotation    Hip external rotation    Knee flexion    Knee extension    Ankle dorsiflexion    Ankle plantarflexion    Ankle inversion    Ankle eversion     (Blank rows = not tested)    SENSATION: Light touch: {intact/deficits:24005} Hot/Cold: {intact/deficits:24005} Proprioception: {intact/deficits:24005}  PALPATION: ***   CERVICAL AROM/PROM  A/PROM A/PROM (deg) 10/09/2021   Flexion   Extension   Right lateral flexion   Left lateral flexion   Right rotation   Left rotation    (Blank rows = not tested)  UE AROM/PROM:  A/PROM Right 10/09/2021 Left 10/09/2021  Shoulder flexion    Shoulder extension    Shoulder abduction    Shoulder adduction    Shoulder extension    Shoulder internal rotation    Shoulder external rotation    Elbow flexion    Elbow extension    Wrist flexion    Wrist extension    Wrist ulnar deviation    Wrist radial deviation    Wrist pronation    Wrist supination     (Blank rows = not tested)  UE MMT:  MMT Right 10/09/2021 Left 10/09/2021  Shoulder flexion    Shoulder extension    Shoulder abduction    Shoulder adduction    Shoulder extension    Middle trapezius    Lower trapezius    Elbow flexion    Elbow extension    Wrist flexion    Wrist extension    Wrist ulnar deviation    Wrist radial deviation    Wrist pronation    Wrist supination    Grip strength     (Blank rows = not tested)  CERVICAL SPECIAL TESTS:  {Cervical special tests:25246}   FUNCTIONAL TESTS:  {Functional tests:24029}  LUMBAR SPECIAL TESTS:  {lumbar special test:25242}  FUNCTIONAL TESTS:  {Functional tests:24029}  GAIT: Distance walked: *** Assistive device utilized: {Assistive devices:23999} Level of assistance: {Levels of assistance:24026} Comments: ***    TODAY'S TREATMENT  ***   PATIENT EDUCATION:  Education details: *** Person educated: {Person educated:25204} Education method: {Education Method:25205} Education comprehension: {Education Comprehension:25206}   HOME EXERCISE PROGRAM: ***  ASSESSMENT:  CLINICAL IMPRESSION: Patient is a 21 y.o. female who was seen today for physical therapy evaluation and treatment for neck and back pain. Objective impairments include {opptimpairments:25111}. These impairments are limiting patient from {activity limitations:25113}. Personal factors including {Personal factors:25162}  are also affecting patient's functional outcome. Patient will benefit from skilled PT to address above impairments and improve overall function.  REHAB POTENTIAL: {rehabpotential:25112}  CLINICAL DECISION MAKING: {clinical decision making:25114}  EVALUATION COMPLEXITY: {Evaluation complexity:25115}   GOALS: Goals reviewed with patient? {yes/no:20286}  SHORT TERM GOALS:  STG Name Target Date Goal status  1 *** Baseline:  {follow up:25551} {GOALSTATUS:25110}  2 *** Baseline:  {follow up:25551} {GOALSTATUS:25110}  3 *** Baseline: {follow up:25551} {GOALSTATUS:25110}  4 *** Baseline: {follow up:25551} {GOALSTATUS:25110}  5 *** Baseline: {follow up:25551} {GOALSTATUS:25110}  6 *** Baseline: {follow up:25551} {GOALSTATUS:25110}  7 *** Baseline: {follow up:25551} {GOALSTATUS:25110}   LONG TERM GOALS:   LTG Name Target Date Goal status  1 *** Baseline: {follow up:25551} {GOALSTATUS:25110}  2 *** Baseline: {follow up:25551} {GOALSTATUS:25110}  3 *** Baseline: {follow up:25551} {GOALSTATUS:25110}  4 *** Baseline: {follow up:25551} {GOALSTATUS:25110}  5 *** Baseline: {follow up:25551} {GOALSTATUS:25110}  6 *** Baseline: {follow up:25551} {GOALSTATUS:25110}  7 *** Baseline: {follow up:25551} {GOALSTATUS:25110}   PLAN: PT FREQUENCY: {rehab frequency:25116}  PT DURATION: {rehab duration:25117}  PLANNED INTERVENTIONS: {rehab planned interventions:25118::"Therapeutic exercises","Therapeutic activity","Neuro Muscular re-education","Balance training","Gait training","Patient/Family education","Joint mobilization"}  PLAN FOR NEXT SESSION: ***   Thurley Francesconi, PT 10/09/2021, 8:12 AM

## 2021-10-11 NOTE — Progress Notes (Deleted)
Follow Up Note  RE: Alexandra Horton MRN: [id number] DOB: 09/07/2000 Date of Office Visit: 10/12/2021  Referring provider: Lucky Cowboy, MD Primary care provider: Lucky Cowboy, MD  Chief Complaint: No chief complaint on file.  History of Present Illness: I had the pleasure of seeing Alexandra Horton for a follow up visit at the [facility] on 10/11/2021. She is a 21 y.o. female, who is being followed for allergic rhinoconjunctivitis. Her previous allergy office visit was on 07/15/2021 with Dr. Selena Horton. Today is a regular follow up visit.  Other allergic rhinitis Rhinoconjunctivitis symptoms for the last few months especially around her pets.  Sometimes even breaks out in rashes. Tried Zyrtec, Claritin with minimal benefit.  No prior allergy evaluation. 2 dogs and 1 cat at home. Today's skin testing showed: Positive to grass, ragweed, trees, mold. Negative to cat and dog. Start environmental control measures as below. Start Singulair (montelukast) 10mg  daily at night. Cautioned that in some children/adults can experience behavioral changes including hyperactivity, agitation, depression, sleep disturbances and suicidal ideations. These side effects are rare, but if you notice them you should notify me and discontinue Singulair (montelukast). Use olopatadine eye drops 0.2% once a day as needed for itchy/watery eyes. Take Xyzal (levocetirizine) daily as needed. May take twice a day during allergy flares. May switch antihistamines every few months. Consider allergy injections for long term control if above medications do not help the symptoms. Will get bloodwork to cat and dog first as clinical history highly suggestive for cat/dog allergy.    Allergic conjunctivitis of both eyes See assessment and plan as above.   Return in about 2 months (around 09/14/2021).  Assessment and Plan: Alexandra Horton is a 21 y.o. female with: No problem-specific Assessment & Plan notes found for this  encounter.  No follow-ups on file.  No orders of the defined types were placed in this encounter.  Lab Orders  No laboratory test(s) ordered today    Diagnostics: Spirometry:  Tracings reviewed. Her effort: {Blank single:19197::"Good reproducible efforts.","It was hard to get consistent efforts and there is a question as to whether this reflects a maximal maneuver.","Poor effort, data can not be interpreted."} FVC: ***L FEV1: ***L, ***% predicted FEV1/FVC ratio: ***% Interpretation: {Blank single:19197::"Spirometry consistent with mild obstructive disease","Spirometry consistent with moderate obstructive disease","Spirometry consistent with severe obstructive disease","Spirometry consistent with possible restrictive disease","Spirometry consistent with mixed obstructive and restrictive disease","Spirometry uninterpretable due to technique","Spirometry consistent with normal pattern","No overt abnormalities noted given today's efforts"}.  Please see scanned spirometry results for details.  Skin Testing: {Blank single:19197::"Select foods","Environmental allergy panel","Environmental allergy panel and select foods","Food allergy panel","None","Deferred due to recent antihistamines use"}. *** Results discussed with patient/family.   Medication List:  Current Outpatient Medications  Medication Sig Dispense Refill   atomoxetine (STRATTERA) 40 MG capsule Take 1 capsule (40 mg total) by mouth daily. 30 capsule 0   buPROPion (WELLBUTRIN XL) 150 MG 24 hr tablet TAKE 1 TABLET BY MOUTH DAILY FOR MOOD, FOCUS & CONCENTRATION 90 tablet 3   doxycycline (VIBRAMYCIN) 100 MG capsule Take 1 capsule by mouth twice daily with food. 60 capsule 2   doxycycline (VIBRAMYCIN) 100 MG capsule Take 1 capsule by mouth twice daily with food 60 capsule 2   hydrOXYzine (ATARAX) 25 MG tablet Take 1 tablet by mouth 3 times a day 45 tablet 0   hydrOXYzine (ATARAX) 50 MG tablet Take 1 tablet by mouth 3 times a day.  45 tablet 0   levocetirizine (XYZAL) 5 MG  tablet Take 1 tablet by mouth every evening. 30 tablet 3   methylphenidate (CONCERTA) 36 MG PO CR tablet Take 2 tablets (72 mg total) by mouth daily. 60 tablet 0   montelukast (SINGULAIR) 10 MG tablet Take 1 tablet by mouth at bedtime. 30 tablet 3   Olopatadine HCl 0.2 % SOLN Apply 1 drop to eye(s) daily as needed (itchy/watery eyes). 2.5 mL 3   omeprazole (PRILOSEC) 20 MG capsule Take 1 capsule (20 mg total) by mouth daily. 30 capsule 0   ondansetron (ZOFRAN) 8 MG tablet Take 1/2-1 tablet by mouth up to three times a day as needed for nausea/vomiting. 60 tablet 1   rizatriptan (MAXALT) 10 MG tablet TAKE 1 TABLET BY MOUTH ONCE AS NEEDED FOR MIGRAINE, MAY REPEAT IN 2 HOURS AS NEEDED 30 tablet 2   sertraline (ZOLOFT) 100 MG tablet Take 1 & 1/2 tablets (150 mg total) by mouth daily. 135 tablet 3   sertraline (ZOLOFT) 25 MG tablet Take 1 tablet by mouth once a day. 7 tablet 0   spironolactone (ALDACTONE) 50 MG tablet Take 1 tablet by mouth daily for 2 weeks and then increase to 1 tablet by mouth  twice daily. 60 tablet 0   spironolactone (ALDACTONE) 50 MG tablet Take 2 tablets by mouth daily 60 tablet 2   traZODone (DESYREL) 50 MG tablet Take 1 and 1/2 tablet by mouth at bedtime 45 tablet 0   tretinoin (RETIN-A) 0.05 % cream Apply pea size amount to entire face at night as directed 45 g 3   tretinoin (RETIN-A) 0.05 % cream Apply pea sized amount to entire face nightly. 45 g 3   Vilazodone HCl (VIIBRYD) 10 MG TABS Take 1 tablet by mouth once a day. 7 tablet 0   Vilazodone HCl (VIIBRYD) 10 MG TABS Take 1 tablet by mouth once a day  15 tablet 0   Vilazodone HCl 20 MG TABS Take 1 tablet (20 mg total) by mouth daily. 15 tablet 0   No current facility-administered medications for this visit.   Allergies: Allergies  Allergen Reactions   Lactose Intolerance (Gi)    I reviewed her past medical history, social history, family history, and  environmental history and no significant changes have been reported from her previous visit.  Review of Systems  Constitutional:  Negative for appetite change, chills, fever and unexpected weight change.  HENT:  Negative for congestion and rhinorrhea.   Eyes:  Positive for itching.  Respiratory:  Negative for cough, chest tightness, shortness of breath and wheezing.   Cardiovascular:  Negative for chest pain.  Gastrointestinal:  Negative for abdominal pain.  Genitourinary:  Negative for difficulty urinating.  Skin:  Positive for rash.  Allergic/Immunologic: Positive for environmental allergies.  Neurological:  Positive for headaches.   Objective: LMP 09/20/2021  There is no height or weight on file to calculate BMI. Physical Exam Vitals and nursing note reviewed.  Constitutional:      Appearance: Normal appearance. She is well-developed.  HENT:     Head: Normocephalic and atraumatic.     Right Ear: Tympanic membrane and external ear normal.     Left Ear: Tympanic membrane and external ear normal.     Nose: Nose normal.     Mouth/Throat:     Mouth: Mucous membranes are moist.     Pharynx: Oropharynx is clear.  Eyes:     Conjunctiva/sclera: Conjunctivae normal.  Cardiovascular:     Rate and Rhythm: Normal rate and regular rhythm.  Heart sounds: Normal heart sounds. No murmur heard.   No friction rub. No gallop.  Pulmonary:     Effort: Pulmonary effort is normal.     Breath sounds: Normal breath sounds. No wheezing, rhonchi or rales.  Musculoskeletal:     Cervical back: Neck supple.  Skin:    General: Skin is warm.     Findings: No rash.  Neurological:     Mental Status: She is alert and oriented to person, place, and time.  Psychiatric:        Behavior: Behavior normal.  Previous notes and tests were reviewed. The plan was reviewed with the patient/family, and all questions/concerned were addressed.  It was my pleasure to see Alexandra Horton today and participate in her  care. Please feel free to contact me with any questions or concerns.  Sincerely,  Wyline Mood, DO Allergy & Immunology  [facility] office: [phone] [facility] office: [phone]

## 2021-10-12 ENCOUNTER — Ambulatory Visit: Payer: No Typology Code available for payment source | Admitting: Allergy

## 2021-10-12 DIAGNOSIS — H1010 Acute atopic conjunctivitis, unspecified eye: Secondary | ICD-10-CM

## 2021-10-19 ENCOUNTER — Other Ambulatory Visit (HOSPITAL_COMMUNITY): Payer: Self-pay

## 2021-10-20 ENCOUNTER — Other Ambulatory Visit (HOSPITAL_COMMUNITY): Payer: Self-pay

## 2021-10-26 ENCOUNTER — Other Ambulatory Visit (HOSPITAL_COMMUNITY): Payer: Self-pay

## 2021-10-26 MED ORDER — ATOMOXETINE HCL 40 MG PO CAPS
ORAL_CAPSULE | ORAL | 0 refills | Status: DC
  Filled 2021-10-26: qty 30, 30d supply, fill #0

## 2021-10-26 MED ORDER — VILAZODONE HCL 20 MG PO TABS
ORAL_TABLET | ORAL | 0 refills | Status: DC
Start: 2021-10-26 — End: 2022-02-04
  Filled 2021-10-26: qty 30, 30d supply, fill #0

## 2021-10-30 ENCOUNTER — Other Ambulatory Visit (HOSPITAL_COMMUNITY): Payer: Self-pay

## 2021-11-09 ENCOUNTER — Other Ambulatory Visit (HOSPITAL_COMMUNITY): Payer: Self-pay

## 2021-11-10 ENCOUNTER — Other Ambulatory Visit (HOSPITAL_COMMUNITY): Payer: Self-pay

## 2021-11-10 MED ORDER — HYDROXYZINE HCL 50 MG PO TABS
50.0000 mg | ORAL_TABLET | Freq: Three times a day (TID) | ORAL | 0 refills | Status: DC
  Filled 2021-11-10: qty 90, 30d supply, fill #0

## 2021-11-11 ENCOUNTER — Other Ambulatory Visit (HOSPITAL_COMMUNITY): Payer: Self-pay

## 2021-11-16 ENCOUNTER — Other Ambulatory Visit (HOSPITAL_COMMUNITY): Payer: Self-pay

## 2021-11-18 ENCOUNTER — Ambulatory Visit: Payer: No Typology Code available for payment source | Admitting: Allergy

## 2021-11-19 ENCOUNTER — Other Ambulatory Visit (HOSPITAL_COMMUNITY): Payer: Self-pay

## 2021-11-19 MED ORDER — VILAZODONE HCL 40 MG PO TABS
ORAL_TABLET | ORAL | 0 refills | Status: DC
  Filled 2021-11-19: qty 30, 30d supply, fill #0

## 2021-11-19 MED ORDER — TRAZODONE HCL 100 MG PO TABS
ORAL_TABLET | ORAL | 0 refills | Status: DC
  Filled 2021-11-19 – 2022-04-05 (×2): qty 30, 30d supply, fill #0

## 2021-11-19 MED ORDER — ATOMOXETINE HCL 40 MG PO CAPS
ORAL_CAPSULE | ORAL | 0 refills | Status: DC
  Filled 2021-11-19: qty 30, 30d supply, fill #0

## 2021-11-19 MED ORDER — HYDROXYZINE HCL 50 MG PO TABS
50.0000 mg | ORAL_TABLET | Freq: Three times a day (TID) | ORAL | 0 refills | Status: DC
  Filled 2021-11-19: qty 90, 30d supply, fill #0

## 2021-11-19 MED ORDER — TRAZODONE HCL 100 MG PO TABS
ORAL_TABLET | ORAL | 0 refills | Status: DC
  Filled 2021-11-19: qty 30, 20d supply, fill #0

## 2021-11-24 ENCOUNTER — Encounter: Payer: Self-pay | Admitting: Adult Health

## 2021-11-27 ENCOUNTER — Other Ambulatory Visit: Payer: Self-pay | Admitting: Internal Medicine

## 2021-11-27 ENCOUNTER — Other Ambulatory Visit (HOSPITAL_COMMUNITY): Payer: Self-pay

## 2021-11-27 ENCOUNTER — Other Ambulatory Visit: Payer: Self-pay | Admitting: Adult Health

## 2021-11-27 DIAGNOSIS — F988 Other specified behavioral and emotional disorders with onset usually occurring in childhood and adolescence: Secondary | ICD-10-CM

## 2021-11-27 MED ORDER — ONDANSETRON HCL 8 MG PO TABS
ORAL_TABLET | ORAL | 1 refills | Status: DC
  Filled 2021-11-27: qty 60, fill #0

## 2021-11-27 MED ORDER — BUPROPION HCL ER (XL) 150 MG PO TB24
ORAL_TABLET | ORAL | 3 refills | Status: DC
  Filled 2021-11-27: qty 90, 90d supply, fill #0
  Filled 2021-12-22 – 2022-02-15 (×2): qty 90, 90d supply, fill #1
  Filled 2022-06-07: qty 90, 90d supply, fill #2
  Filled 2022-08-24 – 2022-09-02 (×2): qty 90, 90d supply, fill #3

## 2021-11-27 MED ORDER — ONDANSETRON HCL 8 MG PO TABS
ORAL_TABLET | ORAL | 1 refills | Status: DC
  Filled 2021-11-27: qty 60, 20d supply, fill #0

## 2021-11-28 ENCOUNTER — Other Ambulatory Visit (HOSPITAL_COMMUNITY): Payer: Self-pay

## 2021-12-22 ENCOUNTER — Other Ambulatory Visit (HOSPITAL_COMMUNITY): Payer: Self-pay

## 2021-12-22 ENCOUNTER — Other Ambulatory Visit: Payer: Self-pay | Admitting: Nurse Practitioner

## 2021-12-22 DIAGNOSIS — R10816 Epigastric abdominal tenderness: Secondary | ICD-10-CM

## 2021-12-22 MED ORDER — TRAZODONE HCL 100 MG PO TABS
ORAL_TABLET | ORAL | 0 refills | Status: DC
  Filled 2021-12-22: qty 30, 30d supply, fill #0

## 2021-12-22 MED ORDER — VILAZODONE HCL 40 MG PO TABS
ORAL_TABLET | ORAL | 0 refills | Status: DC
  Filled 2021-12-22: qty 30, 30d supply, fill #0

## 2021-12-22 MED ORDER — HYDROXYZINE HCL 50 MG PO TABS
50.0000 mg | ORAL_TABLET | Freq: Three times a day (TID) | ORAL | 0 refills | Status: DC
  Filled 2021-12-22: qty 90, 30d supply, fill #0

## 2021-12-23 ENCOUNTER — Other Ambulatory Visit (HOSPITAL_COMMUNITY): Payer: Self-pay

## 2021-12-24 ENCOUNTER — Other Ambulatory Visit (HOSPITAL_COMMUNITY): Payer: Self-pay

## 2021-12-26 MED ORDER — OMEPRAZOLE 20 MG PO CPDR
DELAYED_RELEASE_CAPSULE | ORAL | 1 refills | Status: DC
  Filled 2021-12-26 – 2022-01-19 (×2): qty 90, 90d supply, fill #0
  Filled 2022-05-20 – 2022-08-18 (×2): qty 90, 90d supply, fill #1

## 2021-12-28 ENCOUNTER — Other Ambulatory Visit (HOSPITAL_COMMUNITY): Payer: Self-pay

## 2021-12-31 ENCOUNTER — Other Ambulatory Visit (HOSPITAL_COMMUNITY): Payer: Self-pay

## 2022-01-01 ENCOUNTER — Other Ambulatory Visit (HOSPITAL_COMMUNITY): Payer: Self-pay

## 2022-01-01 MED ORDER — VYVANSE 50 MG PO CAPS
ORAL_CAPSULE | ORAL | 0 refills | Status: DC
  Filled 2022-01-01: qty 15, 15d supply, fill #0

## 2022-01-06 ENCOUNTER — Other Ambulatory Visit (HOSPITAL_COMMUNITY): Payer: Self-pay

## 2022-01-07 ENCOUNTER — Other Ambulatory Visit (HOSPITAL_COMMUNITY): Payer: Self-pay

## 2022-01-07 MED ORDER — HYDROXYZINE HCL 50 MG PO TABS
50.0000 mg | ORAL_TABLET | Freq: Three times a day (TID) | ORAL | 0 refills | Status: DC
  Filled 2022-01-07 – 2022-01-21 (×2): qty 90, 30d supply, fill #0

## 2022-01-13 ENCOUNTER — Ambulatory Visit: Payer: No Typology Code available for payment source | Admitting: Allergy

## 2022-01-19 ENCOUNTER — Other Ambulatory Visit (HOSPITAL_COMMUNITY): Payer: Self-pay

## 2022-01-19 MED ORDER — VYVANSE 50 MG PO CAPS
ORAL_CAPSULE | ORAL | 0 refills | Status: DC
  Filled 2022-01-19: qty 30, 30d supply, fill #0

## 2022-01-21 ENCOUNTER — Other Ambulatory Visit (HOSPITAL_COMMUNITY): Payer: Self-pay

## 2022-02-03 ENCOUNTER — Encounter: Payer: Self-pay | Admitting: Adult Health

## 2022-02-04 ENCOUNTER — Ambulatory Visit (INDEPENDENT_AMBULATORY_CARE_PROVIDER_SITE_OTHER): Payer: No Typology Code available for payment source | Admitting: Adult Health

## 2022-02-04 ENCOUNTER — Encounter: Payer: Self-pay | Admitting: Adult Health

## 2022-02-04 ENCOUNTER — Other Ambulatory Visit (HOSPITAL_COMMUNITY): Payer: Self-pay

## 2022-02-04 VITALS — BP 118/76 | HR 97 | Temp 97.9°F | Wt 157.0 lb

## 2022-02-04 DIAGNOSIS — R112 Nausea with vomiting, unspecified: Secondary | ICD-10-CM

## 2022-02-04 LAB — POCT URINE PREGNANCY: Preg Test, Ur: NEGATIVE

## 2022-02-04 MED ORDER — PROMETHAZINE HCL 25 MG PO TABS
25.0000 mg | ORAL_TABLET | Freq: Four times a day (QID) | ORAL | 0 refills | Status: DC | PRN
  Filled 2022-02-04: qty 30, 8d supply, fill #0

## 2022-02-04 NOTE — Progress Notes (Signed)
Assessment and Plan:  Alexandra Horton was seen today for emesis.  Diagnoses and all orders for this visit:  Nausea and vomiting, unspecified vomiting type Benign exam and HPI other than random episodes of emesis, immediately feels better Check labs No imaging unless persistent - plan [place] if lasting longer than 7-10 days to r/o gallstones Otherwise explained refer to GI Please go to the ER if you have any severe AB pain, unable to hold down food/water, blood in stool or vomit, chest pain, shortness of breath, or any worsening symptoms.  -     CBC with Differential/Platelet -     COMPLETE METABOLIC PANEL WITH GFR -     Urinalysis w microscopic + reflex cultur -     POCT urine pregnancy - NEG -     promethazine (PHENERGAN) 25 MG tablet; Take 1 tablet (25 mg total) by mouth every 6 (six) hours as needed for nausea or vomiting (can cause fatigue). Max: 4 tablets per day  Further disposition pending results of labs. Discussed med's effects and SE's.   Over 20 minutes of exam, counseling, chart review, and critical decision making was performed.   Future Appointments  Date Time Provider [place]  04/30/2022 10:00 AM Magda Bernheim, NP [facility] None  09/28/2022  8:00 AM Marny Lowenstein A, NP [facility] None    ------------------------------------------------------------------------------------------------------------------   HPI BP 118/76   Pulse (!) 115   Temp 97.9 F (36.6 C)   Wt 157 lb (71.2 kg)   SpO2 99%   BMI 30.16 kg/m  20 y.o.female presents for evaluation due to 2 episodes  of nausea/emesis in the last 3 days. Reports also had several random episodes of nausea. She denies abdominal pain, diarrhea, known sick exposures, fever/chills.   She notes since last year has intermittently had nausea/emesis episodes, has zofran that previously helped but not recently.   She has been taking omeprazole 20 mg daily recently without benefit Hx of possible intrinsic liver disease or fatty  liver per [place] 2021  Denies new meds/foods - will get nausea/throw up even when has eaten minimal  Vaping was causing nauesa, stopped this.  Denies marijuana, no recent alcohool   Past Medical History:  Diagnosis Date   ADHD (attention deficit hyperactivity disorder)    Dr. Loura Back   Anemia      Allergies  Allergen Reactions   Lactose Intolerance (Gi)     Current Outpatient Medications on File Prior to Visit  Medication Sig   tretinoin (RETIN-A) 0.05 % cream Apply pea sized amount to entire face nightly.   Vilazodone HCl (VIIBRYD) 40 MG TABS Take 1 tablet by mouth once a day   atomoxetine (STRATTERA) 40 MG capsule Take 1 capsule by mouth once a day   buPROPion (WELLBUTRIN XL) 150 MG 24 hr tablet Take 1 tablet by mouth daily for mood , focus & concentration   doxycycline (VIBRAMYCIN) 100 MG capsule Take 1 capsule by mouth twice daily with food   hydrOXYzine (ATARAX) 50 MG tablet Take 1 tablet by mouth 3 times a day.   hydrOXYzine (ATARAX) 50 MG tablet Take 1 tablet by mouth 3 times a day.   hydrOXYzine (ATARAX) 50 MG tablet Take 1 tablet by mouth 3 times a day.   hydrOXYzine (ATARAX) 50 MG tablet Take 1 tablet by mouth 3 times a day.   levocetirizine (XYZAL) 5 MG tablet Take 1 tablet by mouth every evening.   lisdexamfetamine (VYVANSE) 50 MG capsule Take 1 capsule by mouth every morning (  Patient not taking: Reported on 02/04/2022)   methylphenidate (CONCERTA) 36 MG PO CR tablet Take 2 tablets (72 mg total) by mouth daily.   montelukast (SINGULAIR) 10 MG tablet Take 1 tablet by mouth at bedtime.   Olopatadine HCl 0.2 % SOLN Apply 1 drop to eye(s) daily as needed (itchy/watery eyes).   omeprazole (PRILOSEC) 20 MG capsule Take 1 capsule by mouth daily to prevent heart burn & indigestion   ondansetron (ZOFRAN) 8 MG tablet Take 1/2-1 tablet by mouth 3 times a day as needed for nausea/vomiting   rizatriptan (MAXALT) 10 MG tablet TAKE 1 TABLET BY MOUTH ONCE AS NEEDED FOR MIGRAINE, MAY REPEAT IN  2 HOURS AS NEEDED   spironolactone (ALDACTONE) 50 MG tablet Take 2 tablets by mouth daily   traZODone (DESYREL) 100 MG tablet Take 1 tablet by mouth at bedtime   [DISCONTINUED] escitalopram (LEXAPRO) 20 MG tablet Take      1 tablet      Daily       for Mood   No current facility-administered medications on file prior to visit.    ROS: all negative except above.   Physical Exam:  BP 118/76   Pulse (!) 115   Temp 97.9 F (36.6 C)   Wt 157 lb (71.2 kg)   SpO2 99%   BMI 30.16 kg/m   General Appearance: Well nourished, in no apparent distress. Eyes: PERRLA, conjunctiva no swelling or erythema Sinuses: No Frontal/maxillary tenderness ENT/Mouth: Ext aud canals clear, TMs without erythema, bulging. No erythema, swelling, or exudate on post pharynx.  Tonsils not swollen or erythematous. Hearing normal.  Neck: Supple, thyroid normal.  Respiratory: Respiratory effort normal, BS equal bilaterally without rales, rhonchi, wheezing or stridor.  Cardio: RRR with no MRGs. Brisk peripheral pulses without edema.  Abdomen: Soft, + BS.  Non tender, no guarding, rebound, hernias, masses. Lymphatics: Non tender without lymphadenopathy.  Musculoskeletal: normal gait.  Skin: Warm, dry without rashes, lesions, ecchymosis.  Neuro: Normal muscle tone Psych: Awake and oriented X 3, normal affect, Insight and Judgment appropriate.     Izora Ribas, NP 2:00 PM [facility]

## 2022-02-04 NOTE — Patient Instructions (Signed)
Nausea, Adult Nausea is the feeling of having an upset stomach or that you are about to vomit. Nausea on its own is not usually a serious concern, but it may be an early sign of a more serious medical problem. As nausea gets worse, it can lead to vomiting. If vomiting develops, or if you are not able to drink enough fluids, you are at risk of becoming dehydrated. Dehydration can make you tired and thirsty, cause you to have a dry mouth, and decrease how often you urinate. Older adults and people with other diseases or a weak disease-fighting system (immune system) are at higher risk for dehydration. The main goals of treating your nausea are: To relieve your nausea. To limit repeated nausea episodes. To prevent vomiting and dehydration. Follow these instructions at home: Watch your symptoms for any changes. Tell your health care provider about them. Eating and drinking     Take an oral rehydration solution (ORS). This is a drink that is sold at pharmacies and retail stores. Drink clear fluids slowly and in small amounts as you are able. Clear fluids include water, ice chips, low-calorie sports drinks, and fruit juice that has water added (diluted fruit juice). Eat bland, easy-to-digest foods in small amounts as you are able. These foods include bananas, applesauce, rice, lean meats, toast, and crackers. Avoid drinking fluids that contain a lot of sugar or caffeine, such as energy drinks, sports drinks, and soda. Avoid alcohol. Avoid spicy or fatty foods. General instructions Take over-the-counter and prescription medicines only as told by your health care provider. Rest at home while you recover. Drink enough fluid to keep your urine pale yellow. Breathe slowly and deeply when you feel nauseous. Avoid smelling things that have strong odors. Wash your hands often using soap and water for at least 20 seconds. If soap and water are not available, use hand sanitizer. Make sure that everyone in  your household washes their hands well and often. Keep all follow-up visits. This is important. Contact a health care provider if: Your nausea gets worse. Your nausea does not go away after two days. You vomit multiple times. You cannot drink fluids without vomiting. You have any of the following: New symptoms. A fever. A headache. Muscle cramps. A rash. Pain while urinating. You feel light-headed or dizzy. Get help right away if: You have pain in your chest, neck, arm, or jaw. You feel extremely weak or you faint. You have vomit that is bright red or looks like coffee grounds. You have bloody or black stools (feces) or stools that look like tar. You have a severe headache, a stiff neck, or both. You have severe pain, cramping, or bloating in your abdomen. You have difficulty breathing or are breathing very quickly. Your heart is beating very quickly. Your skin feels cold and clammy. You feel confused. You have signs of dehydration, such as: Dark urine, very little urine, or no urine. Cracked lips. Dry mouth. Sunken eyes. Sleepiness. Weakness. These symptoms may be an emergency. Get help right away. Call 911. Do not wait to see if the symptoms will go away. Do not drive yourself to the hospital. Summary Nausea is the feeling that you have an upset stomach or that you are about to vomit. Nausea on its own is not usually a serious concern, but it may be an early sign of a more serious medical problem. If vomiting develops, or if you are not able to drink enough fluids, you are at risk of becoming   dehydrated. Follow recommendations for eating and drinking and take over-the-counter and prescription medicines only as told by your health care provider. Contact a health care provider right away if your symptoms worsen or you have new symptoms. Keep all follow-up visits. This is important. This information is not intended to replace advice given to you by your health care provider.  Make sure you discuss any questions you have with your health care provider. Document Revised: 02/27/2021 Document Reviewed: 02/27/2021 Elsevier Patient Education  2023 Elsevier Inc.  

## 2022-02-05 ENCOUNTER — Other Ambulatory Visit: Payer: Self-pay | Admitting: Adult Health

## 2022-02-05 DIAGNOSIS — D72810 Lymphocytopenia: Secondary | ICD-10-CM

## 2022-02-05 DIAGNOSIS — R7989 Other specified abnormal findings of blood chemistry: Secondary | ICD-10-CM

## 2022-02-05 LAB — CBC WITH DIFFERENTIAL/PLATELET
Absolute Monocytes: 964 cells/uL — ABNORMAL HIGH (ref 200–950)
Basophils Absolute: 18 cells/uL (ref 0–200)
Basophils Relative: 0.3 %
Eosinophils Absolute: 18 cells/uL (ref 15–500)
Eosinophils Relative: 0.3 %
HCT: 35.9 % (ref 35.0–45.0)
Hemoglobin: 11.7 g/dL (ref 11.7–15.5)
Lymphs Abs: 561 cells/uL — ABNORMAL LOW (ref 850–3900)
MCH: 27.9 pg (ref 27.0–33.0)
MCHC: 32.6 g/dL (ref 32.0–36.0)
MCV: 85.5 fL (ref 80.0–100.0)
MPV: 10.9 fL (ref 7.5–12.5)
Monocytes Relative: 15.8 %
Neutro Abs: 4538 cells/uL (ref 1500–7800)
Neutrophils Relative %: 74.4 %
Platelets: 281 10*3/uL (ref 140–400)
RBC: 4.2 10*6/uL (ref 3.80–5.10)
RDW: 13.4 % (ref 11.0–15.0)
Total Lymphocyte: 9.2 %
WBC: 6.1 10*3/uL (ref 3.8–10.8)

## 2022-02-05 LAB — COMPLETE METABOLIC PANEL WITH GFR
AG Ratio: 1.6 (calc) (ref 1.0–2.5)
ALT: 56 U/L — ABNORMAL HIGH (ref 6–29)
AST: 21 U/L (ref 10–30)
Albumin: 4 g/dL (ref 3.6–5.1)
Alkaline phosphatase (APISO): 59 U/L (ref 31–125)
BUN: 12 mg/dL (ref 7–25)
CO2: 26 mmol/L (ref 20–32)
Calcium: 9.3 mg/dL (ref 8.6–10.2)
Chloride: 108 mmol/L (ref 98–110)
Creat: 0.76 mg/dL (ref 0.50–0.96)
Globulin: 2.5 g/dL (calc) (ref 1.9–3.7)
Glucose, Bld: 85 mg/dL (ref 65–99)
Potassium: 3.8 mmol/L (ref 3.5–5.3)
Sodium: 142 mmol/L (ref 135–146)
Total Bilirubin: 0.3 mg/dL (ref 0.2–1.2)
Total Protein: 6.5 g/dL (ref 6.1–8.1)
eGFR: 115 mL/min/{1.73_m2} (ref >=60)

## 2022-02-05 LAB — URINALYSIS W MICROSCOPIC + REFLEX CULTURE
Bacteria, UA: NONE SEEN /HPF
Bilirubin Urine: NEGATIVE
Glucose, UA: NEGATIVE
Hgb urine dipstick: NEGATIVE
Hyaline Cast: NONE SEEN /LPF
Leukocyte Esterase: NEGATIVE
Nitrites, Initial: NEGATIVE
RBC / HPF: NONE SEEN /HPF (ref 0–2)
Specific Gravity, Urine: 1.032 (ref 1.001–1.035)
WBC, UA: NONE SEEN /HPF (ref 0–5)
pH: 6.5 (ref 5.0–8.0)

## 2022-02-05 LAB — NO CULTURE INDICATED

## 2022-02-15 ENCOUNTER — Other Ambulatory Visit (HOSPITAL_COMMUNITY): Payer: Self-pay

## 2022-02-16 ENCOUNTER — Other Ambulatory Visit (HOSPITAL_COMMUNITY): Payer: Self-pay

## 2022-02-16 MED ORDER — VILAZODONE HCL 40 MG PO TABS
40.0000 mg | ORAL_TABLET | Freq: Every day | ORAL | 0 refills | Status: DC
  Filled 2022-02-16: qty 30, 30d supply, fill #0

## 2022-02-16 MED ORDER — HYDROXYZINE HCL 50 MG PO TABS
50.0000 mg | ORAL_TABLET | Freq: Three times a day (TID) | ORAL | 0 refills | Status: AC
Start: 2022-02-16 — End: ?
  Filled 2022-02-16 – 2022-05-20 (×2): qty 90, 30d supply, fill #0

## 2022-02-16 MED ORDER — HYDROXYZINE HCL 50 MG PO TABS
50.0000 mg | ORAL_TABLET | Freq: Three times a day (TID) | ORAL | 0 refills | Status: DC
Start: 2022-02-16 — End: 2022-09-28
  Filled 2022-02-16: qty 90, 30d supply, fill #0

## 2022-02-18 ENCOUNTER — Other Ambulatory Visit (HOSPITAL_COMMUNITY): Payer: Self-pay

## 2022-02-20 ENCOUNTER — Other Ambulatory Visit (HOSPITAL_COMMUNITY): Payer: Self-pay

## 2022-02-22 ENCOUNTER — Other Ambulatory Visit (HOSPITAL_COMMUNITY): Payer: Self-pay

## 2022-02-23 ENCOUNTER — Other Ambulatory Visit (HOSPITAL_COMMUNITY): Payer: Self-pay

## 2022-02-23 MED ORDER — METHYLPHENIDATE HCL ER (OSM) 36 MG PO TBCR
EXTENDED_RELEASE_TABLET | ORAL | 0 refills | Status: DC
  Filled 2022-02-23: qty 60, 30d supply, fill #0

## 2022-03-01 ENCOUNTER — Ambulatory Visit (INDEPENDENT_AMBULATORY_CARE_PROVIDER_SITE_OTHER): Payer: No Typology Code available for payment source | Admitting: Nurse Practitioner

## 2022-03-01 ENCOUNTER — Encounter: Payer: Self-pay | Admitting: Nurse Practitioner

## 2022-03-01 VITALS — BP 118/78

## 2022-03-01 DIAGNOSIS — R1031 Right lower quadrant pain: Secondary | ICD-10-CM | POA: Diagnosis not present

## 2022-03-01 DIAGNOSIS — N946 Dysmenorrhea, unspecified: Secondary | ICD-10-CM | POA: Diagnosis not present

## 2022-03-01 NOTE — Progress Notes (Signed)
   Acute Office Visit  Subjective:    Patient ID: Alexandra Horton, female    DOB: 07-17-2001, 21 y.o.   MRN: [id number]   HPI 21 y.o. presents today for RLQ abdominal pain. She reports severe abdominal pain on right side during menses and that menses are usually heavy first few days and then tapers off for total bleeding time of 5 days. She goes through a pad every hour cycle day 1. Last month she had one heavy day of bleeding and then it stopped. Pain also occurs when she is not on her menses. It is always right sided and sharp or crampy. No changes in bowel habits. Aunt with history of endometriosis. Had IUD removed in December due to cramping and fear of weight gain. Pain has worsened. Saw PCP for RLQ pain and nausea 02/04/2022. H/O possible intrinsic liver disease or fatty liver, mildly elevated ALT at 56 on 02/04/2022. Negative UPT and UA at that time. Nausea better.    Review of Systems  Constitutional: Negative.   Gastrointestinal:  Positive for abdominal pain (RLQ). Negative for abdominal distention, blood in stool, constipation, diarrhea, nausea and vomiting.  Genitourinary:  Positive for menstrual problem and pelvic pain. Negative for dysuria, flank pain, frequency, hematuria, urgency, vaginal discharge and vaginal pain.       Objective:    Physical Exam Constitutional:      Appearance: Normal appearance.  Abdominal:     Tenderness: There is abdominal tenderness (Very mild with palpation) in the right lower quadrant. There is no guarding or rebound.  Genitourinary:    General: Normal vulva.     Uterus: Normal.      Adnexa: Right adnexa normal.     BP 118/78   LMP 02/19/2022  Wt Readings from Last 3 Encounters:  02/04/22 157 lb (71.2 kg)  09/24/21 155 lb (70.3 kg)  08/25/21 155 lb (70.3 kg)        Patient informed chaperone available to be present for breast and/or pelvic exam. Patient has requested no chaperone to be present. Patient has been advised what will be completed  during breast and pelvic exam.   Assessment & Plan:   Problem List Items Addressed This Visit   None Visit Diagnoses     Right lower quadrant pain    -  Primary   Dysmenorrhea          Plan: We discussed possibility of endometriosis including diagnosis and management. She will consider hormonal contraceptive. Contraceptive options were reviewed, including hormonal methods, both combination (pill, patch, vaginal ring) and progesterone-only (pill, Depo Provera and Nexplanon), and intrauterine devices (Mirena, [place], [place]). The mechanisms, risks, benefits and side effects of all methods were discussed. All questions answered. Ibuprofen for cramping.      Olivia Mackie DNP, 3:23 PM 03/01/2022

## 2022-03-02 ENCOUNTER — Encounter: Payer: Self-pay | Admitting: Nurse Practitioner

## 2022-03-02 NOTE — Telephone Encounter (Signed)
I called patient and advised her that Tiffany, NP recommended she head to the ER for visit for these severe symptoms.  Patient said that she had laid down and she is already feeling better. Sx only slight now.

## 2022-03-03 ENCOUNTER — Other Ambulatory Visit (HOSPITAL_COMMUNITY): Payer: Self-pay

## 2022-03-05 ENCOUNTER — Other Ambulatory Visit (HOSPITAL_COMMUNITY): Payer: Self-pay

## 2022-03-05 MED ORDER — DEXMETHYLPHENIDATE HCL ER 25 MG PO CP24
15.0000 mg | ORAL_CAPSULE | Freq: Every morning | ORAL | 0 refills | Status: DC
  Filled 2022-03-05: qty 10, 10d supply, fill #0
  Filled 2022-03-08: qty 5, 5d supply, fill #0

## 2022-03-05 MED ORDER — VILAZODONE HCL 40 MG PO TABS
40.0000 mg | ORAL_TABLET | Freq: Every day | ORAL | 0 refills | Status: DC
  Filled 2022-03-05 – 2022-04-05 (×2): qty 30, 30d supply, fill #0

## 2022-03-05 MED ORDER — TRAZODONE HCL 100 MG PO TABS
100.0000 mg | ORAL_TABLET | Freq: Every evening | ORAL | 0 refills | Status: DC
  Filled 2022-03-05: qty 30, 30d supply, fill #0

## 2022-03-08 ENCOUNTER — Ambulatory Visit: Payer: No Typology Code available for payment source | Admitting: Nurse Practitioner

## 2022-03-08 ENCOUNTER — Encounter: Payer: Self-pay | Admitting: Nurse Practitioner

## 2022-03-08 ENCOUNTER — Other Ambulatory Visit: Payer: Self-pay | Admitting: Nurse Practitioner

## 2022-03-08 ENCOUNTER — Other Ambulatory Visit (HOSPITAL_COMMUNITY): Payer: Self-pay

## 2022-03-08 DIAGNOSIS — N920 Excessive and frequent menstruation with regular cycle: Secondary | ICD-10-CM

## 2022-03-08 DIAGNOSIS — N946 Dysmenorrhea, unspecified: Secondary | ICD-10-CM

## 2022-03-08 MED ORDER — NORETHIN ACE-ETH ESTRAD-FE 1-20 MG-MCG PO TABS
1.0000 | ORAL_TABLET | Freq: Every day | ORAL | 2 refills | Status: DC
  Filled 2022-03-08: qty 84, 84d supply, fill #0

## 2022-03-12 ENCOUNTER — Other Ambulatory Visit (HOSPITAL_COMMUNITY): Payer: Self-pay

## 2022-03-17 ENCOUNTER — Other Ambulatory Visit: Payer: No Typology Code available for payment source

## 2022-03-17 DIAGNOSIS — R112 Nausea with vomiting, unspecified: Secondary | ICD-10-CM

## 2022-03-17 DIAGNOSIS — R7989 Other specified abnormal findings of blood chemistry: Secondary | ICD-10-CM

## 2022-03-17 DIAGNOSIS — D72810 Lymphocytopenia: Secondary | ICD-10-CM

## 2022-03-18 LAB — CBC WITH DIFFERENTIAL/PLATELET
Absolute Monocytes: 759 cells/uL (ref 200–950)
Basophils Absolute: 51 cells/uL (ref 0–200)
Basophils Relative: 0.7 %
Eosinophils Absolute: 110 cells/uL (ref 15–500)
Eosinophils Relative: 1.5 %
HCT: 37.1 % (ref 35.0–45.0)
Hemoglobin: 12.4 g/dL (ref 11.7–15.5)
Lymphs Abs: 3205 cells/uL (ref 850–3900)
MCH: 27.7 pg (ref 27.0–33.0)
MCHC: 33.4 g/dL (ref 32.0–36.0)
MCV: 82.8 fL (ref 80.0–100.0)
MPV: 10.6 fL (ref 7.5–12.5)
Monocytes Relative: 10.4 %
Neutro Abs: 3176 cells/uL (ref 1500–7800)
Neutrophils Relative %: 43.5 %
Platelets: 298 10*3/uL (ref 140–400)
RBC: 4.48 10*6/uL (ref 3.80–5.10)
RDW: 13.8 % (ref 11.0–15.0)
Total Lymphocyte: 43.9 %
WBC: 7.3 10*3/uL (ref 3.8–10.8)

## 2022-03-18 LAB — HEPATIC FUNCTION PANEL
AG Ratio: 1.7 (calc) (ref 1.0–2.5)
ALT: 15 U/L (ref 6–29)
AST: 15 U/L (ref 10–30)
Albumin: 4.5 g/dL (ref 3.6–5.1)
Alkaline phosphatase (APISO): 47 U/L (ref 31–125)
Bilirubin, Direct: 0.1 mg/dL (ref 0.0–0.2)
Globulin: 2.7 g/dL (calc) (ref 1.9–3.7)
Indirect Bilirubin: 0.2 mg/dL (calc) (ref 0.2–1.2)
Total Bilirubin: 0.3 mg/dL (ref 0.2–1.2)
Total Protein: 7.2 g/dL (ref 6.1–8.1)

## 2022-03-19 ENCOUNTER — Encounter: Payer: Self-pay | Admitting: Nurse Practitioner

## 2022-03-22 ENCOUNTER — Encounter: Payer: Self-pay | Admitting: Nurse Practitioner

## 2022-03-23 ENCOUNTER — Other Ambulatory Visit: Payer: Self-pay | Admitting: Nurse Practitioner

## 2022-03-23 ENCOUNTER — Other Ambulatory Visit (HOSPITAL_COMMUNITY): Payer: Self-pay

## 2022-03-23 DIAGNOSIS — N946 Dysmenorrhea, unspecified: Secondary | ICD-10-CM

## 2022-03-23 MED ORDER — IBUPROFEN 600 MG PO TABS
600.0000 mg | ORAL_TABLET | Freq: Three times a day (TID) | ORAL | 1 refills | Status: DC | PRN
  Filled 2022-03-23: qty 60, 20d supply, fill #0

## 2022-03-26 ENCOUNTER — Other Ambulatory Visit (HOSPITAL_COMMUNITY): Payer: Self-pay

## 2022-03-29 ENCOUNTER — Other Ambulatory Visit (HOSPITAL_COMMUNITY): Payer: Self-pay

## 2022-03-31 ENCOUNTER — Other Ambulatory Visit (HOSPITAL_COMMUNITY): Payer: Self-pay

## 2022-04-05 ENCOUNTER — Other Ambulatory Visit (HOSPITAL_COMMUNITY): Payer: Self-pay

## 2022-04-06 ENCOUNTER — Other Ambulatory Visit (HOSPITAL_COMMUNITY): Payer: Self-pay

## 2022-04-07 ENCOUNTER — Encounter: Payer: Self-pay | Admitting: Nurse Practitioner

## 2022-04-08 ENCOUNTER — Other Ambulatory Visit: Payer: Self-pay | Admitting: *Deleted

## 2022-04-08 ENCOUNTER — Other Ambulatory Visit (HOSPITAL_COMMUNITY): Payer: Self-pay

## 2022-04-08 DIAGNOSIS — Z30017 Encounter for initial prescription of implantable subdermal contraceptive: Secondary | ICD-10-CM

## 2022-04-08 DIAGNOSIS — Z975 Presence of (intrauterine) contraceptive device: Secondary | ICD-10-CM

## 2022-04-09 ENCOUNTER — Other Ambulatory Visit (HOSPITAL_COMMUNITY): Payer: Self-pay

## 2022-04-09 MED ORDER — METHYLPHENIDATE HCL ER (OSM) 36 MG PO TBCR
EXTENDED_RELEASE_TABLET | ORAL | 0 refills | Status: DC
  Filled 2022-04-09: qty 60, 30d supply, fill #0

## 2022-04-09 MED ORDER — TRAZODONE HCL 100 MG PO TABS
ORAL_TABLET | ORAL | 0 refills | Status: DC
  Filled 2022-04-09: qty 30, 30d supply, fill #0

## 2022-04-09 MED ORDER — VILAZODONE HCL 40 MG PO TABS
ORAL_TABLET | ORAL | 0 refills | Status: DC
  Filled 2022-04-09: qty 30, 30d supply, fill #0

## 2022-04-09 MED ORDER — HYDROXYZINE HCL 50 MG PO TABS
50.0000 mg | ORAL_TABLET | Freq: Three times a day (TID) | ORAL | 0 refills | Status: DC
  Filled 2022-04-09: qty 90, 30d supply, fill #0

## 2022-04-14 ENCOUNTER — Encounter: Payer: Self-pay | Admitting: Nurse Practitioner

## 2022-04-14 ENCOUNTER — Ambulatory Visit (INDEPENDENT_AMBULATORY_CARE_PROVIDER_SITE_OTHER): Payer: No Typology Code available for payment source | Admitting: Nurse Practitioner

## 2022-04-14 VITALS — BP 112/74 | HR 115

## 2022-04-14 DIAGNOSIS — Z01812 Encounter for preprocedural laboratory examination: Secondary | ICD-10-CM

## 2022-04-14 DIAGNOSIS — Z30017 Encounter for initial prescription of implantable subdermal contraceptive: Secondary | ICD-10-CM | POA: Diagnosis not present

## 2022-04-14 LAB — PREGNANCY, URINE: Preg Test, Ur: NEGATIVE

## 2022-04-14 MED ORDER — ETONOGESTREL 68 MG ~~LOC~~ IMPL: 68.0000 mg | DRUG_IMPLANT | Freq: Once | SUBCUTANEOUS | Status: DC

## 2022-04-14 NOTE — Progress Notes (Signed)
21 y.o. G0P0000 female presents for Nexplanon insertion.  She has been counseled about alternative types of contraception and has decided this long acting method is the best for her.  Procedure, risks and benefits have all been explained.  She has the following questions today:  None.     LMP:  03/19/2022   After all questions were answered, consent was obtained.    Past Medical History:  Diagnosis Date   ADHD (attention deficit hyperactivity disorder)    Dr. Verdie Mosher   Anemia     Past Surgical History:  Procedure Laterality Date   WISDOM TOOTH EXTRACTION  2019    Current Outpatient Medications on File Prior to Visit  Medication Sig Dispense Refill   buPROPion (WELLBUTRIN XL) 150 MG 24 hr tablet Take 1 tablet by mouth daily for mood , focus & concentration 90 tablet 3   hydrOXYzine (ATARAX) 50 MG tablet Take 1 tablet by mouth 3 times a day. 90 tablet 0   hydrOXYzine (ATARAX) 50 MG tablet Take 1 tablet by mouth 3 times a day. 90 tablet 0   ibuprofen (ADVIL) 600 MG tablet Take 1 tablet by mouth every 8 hours as needed. 60 tablet 1   levocetirizine (XYZAL) 5 MG tablet Take 1 tablet by mouth every evening. 30 tablet 3   methylphenidate (CONCERTA) 36 MG PO CR tablet Take 2 tablets (72 mg total) by mouth daily. 60 tablet 0   montelukast (SINGULAIR) 10 MG tablet Take 1 tablet by mouth at bedtime. 30 tablet 3   Olopatadine HCl 0.2 % SOLN Apply 1 drop to eye(s) daily as needed (itchy/watery eyes). 2.5 mL 3   omeprazole (PRILOSEC) 20 MG capsule Take 1 capsule by mouth daily to prevent heart burn & indigestion 90 capsule 1   promethazine (PHENERGAN) 25 MG tablet Take 1 tablet (25 mg total) by mouth every 6 (six) hours as needed for nausea or vomiting (can cause fatigue). Max: 4 tablets per day 30 tablet 0   rizatriptan (MAXALT) 10 MG tablet TAKE 1 TABLET BY MOUTH ONCE AS NEEDED FOR MIGRAINE, MAY REPEAT IN 2 HOURS AS NEEDED 30 tablet 2   spironolactone (ALDACTONE) 50 MG tablet Take 2 tablets by mouth  daily 60 tablet 2   tretinoin (RETIN-A) 0.05 % cream Apply pea sized amount to entire face nightly. 45 g 3   Vilazodone HCl (VIIBRYD) 40 MG TABS Take 1 tablet by mouth daily. 30 tablet 0   doxycycline (VIBRAMYCIN) 100 MG capsule Take 1 capsule by mouth twice daily with food (Patient not taking: Reported on 04/14/2022) 60 capsule 2   [DISCONTINUED] escitalopram (LEXAPRO) 20 MG tablet Take      1 tablet      Daily       for Mood 90 tablet 0   No current facility-administered medications on file prior to visit.   Allergies  Allergen Reactions   Lactose Intolerance (Gi)     Vitals:   04/14/22 1115  BP: 112/74  Pulse: (!) 115  SpO2: 99%   Physical Exam  Procedure: Patient placed supine on exam table with her left arm flexed at the elbow. The location for insertion site was identified 8-10 cm from epicondyle and 3-5 cm posterior.  Area cleansed with Betadine x 3.  Insertion site and path of insertion was anesthetized with 1% Lidocaine without epinephrine, 1.5 cc total.  Using Nexplanon device (and after confirming presence of rod in device), skin was pierced and then elevated along insertion path, passing device just  under the skin.  Rod released and device inserted.  Rod palpated easily.  Steri-strips applied and a pressure bandage was place over the site.  Entire procedure performed with sterile technique.  Assessment: Nexplanon insertion.  Negative UPT  Plan:  Post procedure instructions reviewed with pt.  Questions answered.  Pt knows to call with any concerns or questions.  Pt is aware removal is due by 3 calendar years from today.

## 2022-04-17 ENCOUNTER — Encounter: Payer: Self-pay | Admitting: Nurse Practitioner

## 2022-04-28 NOTE — Progress Notes (Deleted)
Complete Physical  Assessment and Plan:  Alexandra Horton was seen today for annual exam.  Diagnoses and all orders for this visit:  Encounter for routine adult health examination without abnormal findings  Due Annually  Refused Pap, will plan for next year  Refused Tetanus, will plan for next year or if gets cut  Attention deficit disorder (ADD) without hyperactivity  Continue Concerta CR 49QP 1-2 tabs daily  Depression with anxiety  Continue Wellbutrin and Zoloft, but is not interested in medication changes at this time.  She is given names for several counselors to begin therapy and pt is very interrested.   Obesity (BMI 30-39.9) -     COMPLETE METABOLIC PANEL WITH GFR -     Lipid panel - Long discussion on diet and exercsie, importance of fresh fruits/vegetables and fiber.  Encouraged adequate water intake and limiting processed/prepared foods.  Also stressed the importance of daily exercsie  Screening for deficiency anemia -     CBC with Differential/Platelet  Screening for thyroid disorder -     TSH  Screening for hematuria or proteinuria -     Urinalysis, Routine w reflex microscopic -     Microalbumin / creatinine urine ratio  Vitamin D deficiency -     VITAMIN D 25 Hydroxy (Vit-D Deficiency, Fractures) - Stressed importance of Vit D supplementation, last value was 11  Screening, lipid -     Lipid panel       - Stressed importance of diet and exercsie  Medication management -     CBC with Differential/Platelet -     COMPLETE METABOLIC PANEL WITH GFR -     Lipid panel -     TSH -     Hemoglobin A1c -     VITAMIN D 25 Hydroxy (Vit-D Deficiency, Fractures) -     Magnesium -     Urinalysis, Routine w reflex microscopic -     Microalbumin / creatinine urine ratio  Screening for diabetes mellitus -     Hemoglobin A1c  Acne vulgaris -     Ambulatory referral to Dermatology - Continue current treatment regimen until evaluated by derm  Environmental allergies -      Ambulatory referral to Allergy        - Continue Zyrtec daily  Discussed med's effects and SE's. Screening labs and tests as requested with regular follow-up as recommended. Over 40 minutes of exam, counseling, chart review and critical decision making was performed  HPI  [name] presents for complete physical.  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   Patient has depression and is on medication but interested in pursuing psychotherapy and would like a therapist referral.  She does still have a moderate amount of depression symptoms but is happy with current medications.  Denies suicidal/homicidal thoughts  She is noticing itchy/irritated eyes which she believes is related to allergies. Symptoms started 2 months ago.  Occasional skin irritation and scratchy throat.  She is wanting allergy testing.  She is doing Zyrtec but not relieving symptoms.  She has acne vulgaris and was referred to dermatologist last year but never heard from dermatologist. Would like a dermatology referral.  She does workout.  BMI is There is no height or weight on file to calculate BMI., she has been working on diet and exercise. Wt Readings from Last 3 Encounters:  02/04/22 157 lb (71.2 kg)  09/24/21 155 lb (70.3 kg)  08/25/21 155 lb (  70.3 kg)    Finally, patient has history of Vitamin D Deficiency not is not taking supplementation and last vitamin D was  Lab Results  Component Value Date   VD25OH 18 (L) 04/30/2021    Last CBC was [facility] Lab Results  Component Value Date   WBC 7.3 03/17/2022   HGB 12.4 03/17/2022   HCT 37.1 03/17/2022   MCV 82.8 03/17/2022   PLT 298 03/17/2022      Current Medications:  Current Outpatient Medications on File Prior to Visit  Medication Sig Dispense Refill   buPROPion (WELLBUTRIN XL) 150 MG 24 hr tablet Take 1 tablet by mouth daily for mood , focus & concentration 90 tablet 3   doxycycline (VIBRAMYCIN) 100 MG  capsule Take 1 capsule by mouth twice daily with food (Patient not taking: Reported on 04/14/2022) 60 capsule 2   hydrOXYzine (ATARAX) 50 MG tablet Take 1 tablet by mouth 3 times a day. 90 tablet 0   hydrOXYzine (ATARAX) 50 MG tablet Take 1 tablet by mouth 3 times a day. 90 tablet 0   ibuprofen (ADVIL) 600 MG tablet Take 1 tablet by mouth every 8 hours as needed. 60 tablet 1   levocetirizine (XYZAL) 5 MG tablet Take 1 tablet by mouth every evening. 30 tablet 3   methylphenidate (CONCERTA) 36 MG PO CR tablet Take 2 tablets (72 mg total) by mouth daily. 60 tablet 0   montelukast (SINGULAIR) 10 MG tablet Take 1 tablet by mouth at bedtime. 30 tablet 3   Olopatadine HCl 0.2 % SOLN Apply 1 drop to eye(s) daily as needed (itchy/watery eyes). 2.5 mL 3   omeprazole (PRILOSEC) 20 MG capsule Take 1 capsule by mouth daily to prevent heart burn & indigestion 90 capsule 1   promethazine (PHENERGAN) 25 MG tablet Take 1 tablet (25 mg total) by mouth every 6 (six) hours as needed for nausea or vomiting (can cause fatigue). Max: 4 tablets per day 30 tablet 0   rizatriptan (MAXALT) 10 MG tablet TAKE 1 TABLET BY MOUTH ONCE AS NEEDED FOR MIGRAINE, MAY REPEAT IN 2 HOURS AS NEEDED 30 tablet 2   spironolactone (ALDACTONE) 50 MG tablet Take 2 tablets by mouth daily 60 tablet 2   tretinoin (RETIN-A) 0.05 % cream Apply pea sized amount to entire face nightly. 45 g 3   Vilazodone HCl (VIIBRYD) 40 MG TABS Take 1 tablet by mouth daily. 30 tablet 0   [DISCONTINUED] escitalopram (LEXAPRO) 20 MG tablet Take      1 tablet      Daily       for Mood 90 tablet 0   Current Facility-Administered Medications on File Prior to Visit  Medication Dose Route Frequency Provider Last Rate Last Admin   etonogestrel (NEXPLANON) implant 68 mg  68 mg Subdermal Once Tamela Gammon, NP       Health Maintenance:   Immunization History  Administered Date(s) Administered   DTaP 06/14/2001, 08/16/2001, 10/11/2001, 07/11/2002, 06/22/2005    Hepatitis B 08/12/2001, 06/14/2001, 01/10/2002   HiB (PRP-OMP) 06/14/2001, 08/16/2001, 10/11/2001, 07/11/2002   IPV 06/14/2001, 08/16/2001, 01/10/2002, 06/22/2005   Influenza Nasal 06/15/2007, 10/10/2009, 06/11/2010, 07/05/2011, 08/22/2012   Influenza,Quad,Nasal, Live 07/19/2013   Influenza,inj,quad, With Preservative 06/02/2016   MMR 04/11/2002, 06/22/2005   Moderna Sars-Covid-2 Vaccination 12/04/2019, 01/18/2020   PPD Test 07/18/2020, 09/19/2020   Pneumococcal Conjugate-13 06/14/2001, 08/16/2001, 01/10/2002, 06/22/2005   Td 08/25/2021   Tdap 07/05/2011   Varicella 04/11/2002, 06/22/2005    TD/TDAP: 10/29 2012 declines today Influenza: 06/2020  Pneumovax:N/A  Prevnar 13: N/A HPV vaccines: Declines  LMP: Mirena x 2 years, no period since 11/2019 Sexually Active: no STD testing GC/ Chlamydia/ trichomonas done 02/17/21 all negative Pap: Never- declines today MGM: N/A  Allergies:  Allergies  Allergen Reactions   Lactose Intolerance (Gi)    Medical History:  has ADD (attention deficit disorder); Auditory processing disorder; H/O tics; Acne; Depression with anxiety; Obesity (BMI 30.0-34.9); Elevated LFTs; Migraine without aura and without status migrainosus, not intractable; Other allergic rhinitis; Allergic conjunctivitis of both eyes; and Diarrhea on their problem list. Surgical History:  She  has a past surgical history that includes Wisdom tooth extraction (2019). Family History:  Her family history includes ADD / ADHD in her brother, cousin, and father; Allergic rhinitis in her father and mother; Asthma in her maternal grandmother; Autism in her cousin; Diabetes type I in her brother; Hypertension in her father; Hypothyroidism in her paternal aunt; Migraines in her father, maternal aunt, and maternal grandmother. Social History:   reports that she has never smoked. She has never used smokeless tobacco. She reports current alcohol use. She reports that she does not use  drugs.  Review of Systems: Review of Systems  Constitutional:  Negative for chills and fever.  HENT:  Negative for congestion, hearing loss, sinus pain, sore throat and tinnitus.   Eyes:  Positive for redness. Negative for blurred vision and double vision.  Respiratory:  Negative for cough, hemoptysis, sputum production, shortness of breath and wheezing.   Cardiovascular:  Negative for chest pain, palpitations and leg swelling.  Gastrointestinal:  Negative for abdominal pain, constipation, diarrhea, heartburn, nausea and vomiting.  Genitourinary:  Negative for dysuria and urgency.  Musculoskeletal:  Negative for back pain, falls, joint pain, myalgias and neck pain.  Skin:  Negative for rash.       Cystic acne of face  Neurological:  Negative for dizziness, tingling, tremors, weakness and headaches.  Endo/Heme/Allergies:  Does not bruise/bleed easily.  Psychiatric/Behavioral:  Positive for depression. Negative for suicidal ideas. The patient is not nervous/anxious and does not have insomnia.     Physical Exam: Estimated body mass index is 30.16 kg/m as calculated from the following:   Height as of 09/24/21: 5' 0.5" (1.537 m).   Weight as of 02/04/22: 157 lb (71.2 kg). LMP 03/19/2022 (Exact Date)  General Appearance: Well nourished, in no apparent distress.  Eyes: Noticed blinking tic. PERRLA, EOMs, conjunctiva no swelling or erythema, normal fundi and vessels.  Sinuses: No Frontal/maxillary tenderness  ENT/Mouth: Ext aud canals clear, normal light reflex with TMs without erythema, bulging. Good dentition. No erythema, swelling, or exudate on post pharynx. Tonsils not swollen or erythematous. Hearing normal.  Neck: Supple, thyroid normal. No bruits  Respiratory: Respiratory effort normal, BS equal bilaterally without rales, rhonchi, wheezing or stridor.  Cardio: RRR without murmurs, rubs or gallops. Brisk peripheral pulses without edema.  Chest: symmetric, with normal excursions and  percussion.  Breasts: Symmetric, without lumps, nipple discharge, retractions.  Abdomen: Soft, nontender, no guarding, rebound, hernias, masses, or organomegaly.  Lymphatics: Non tender without lymphadenopathy.  Genitourinary: Defer per patient refusal Musculoskeletal: Full ROM all peripheral extremities,5/5 strength, and normal gait.  Skin: Warm, dry without rashes, noticeable cystic acne of face Neuro: Cranial nerves intact, reflexes equal bilaterally. Normal muscle tone, no cerebellar symptoms. Sensation intact.  Psych: Awake and oriented X 3, normal affect, Insight and Judgment appropriate.   EKG: defer  Roann Merk E  12:51 PM [facility]

## 2022-04-30 ENCOUNTER — Encounter: Payer: No Typology Code available for payment source | Admitting: Nurse Practitioner

## 2022-04-30 DIAGNOSIS — F418 Other specified anxiety disorders: Secondary | ICD-10-CM

## 2022-04-30 DIAGNOSIS — Z0000 Encounter for general adult medical examination without abnormal findings: Secondary | ICD-10-CM

## 2022-04-30 DIAGNOSIS — L709 Acne, unspecified: Secondary | ICD-10-CM

## 2022-04-30 DIAGNOSIS — Z9109 Other allergy status, other than to drugs and biological substances: Secondary | ICD-10-CM

## 2022-04-30 DIAGNOSIS — F988 Other specified behavioral and emotional disorders with onset usually occurring in childhood and adolescence: Secondary | ICD-10-CM

## 2022-04-30 DIAGNOSIS — Z79899 Other long term (current) drug therapy: Secondary | ICD-10-CM

## 2022-04-30 DIAGNOSIS — Z1389 Encounter for screening for other disorder: Secondary | ICD-10-CM

## 2022-04-30 DIAGNOSIS — Z1329 Encounter for screening for other suspected endocrine disorder: Secondary | ICD-10-CM

## 2022-04-30 DIAGNOSIS — E559 Vitamin D deficiency, unspecified: Secondary | ICD-10-CM

## 2022-04-30 DIAGNOSIS — Z131 Encounter for screening for diabetes mellitus: Secondary | ICD-10-CM

## 2022-04-30 DIAGNOSIS — E782 Mixed hyperlipidemia: Secondary | ICD-10-CM

## 2022-04-30 DIAGNOSIS — E669 Obesity, unspecified: Secondary | ICD-10-CM

## 2022-05-06 ENCOUNTER — Encounter: Payer: Self-pay | Admitting: Nurse Practitioner

## 2022-05-10 ENCOUNTER — Encounter: Payer: Self-pay | Admitting: Nurse Practitioner

## 2022-05-11 ENCOUNTER — Encounter: Payer: Self-pay | Admitting: Nurse Practitioner

## 2022-05-11 NOTE — Telephone Encounter (Signed)
I responded in previous message explaining that irregular bleeding is normal with Nexplanon. She just had placed last month. Ibuprofen for pain recommended.

## 2022-05-11 NOTE — Telephone Encounter (Signed)
Tiffany I put her pharmacy in the chart incase you want to prescribe Rx.

## 2022-05-17 NOTE — Progress Notes (Deleted)
Complete Physical  Assessment and Plan:  Alexandra Horton was seen today for annual exam.  Diagnoses and all orders for this visit:  Encounter for routine adult health examination without abnormal findings  Due Annually  Refused Pap, will plan for next year  Refused Tetanus, will plan for next year or if gets cut  Attention deficit disorder (ADD) without hyperactivity  Continue Concerta CR 74YC 1-2 tabs daily  Depression with anxiety  Continue Wellbutrin and Zoloft, but is not interested in medication changes at this time.  She is given names for several counselors to begin therapy and pt is very interrested.   Obesity (BMI 30-39.9) -     COMPLETE METABOLIC PANEL WITH GFR -     Lipid panel - Long discussion on diet and exercsie, importance of fresh fruits/vegetables and fiber.  Encouraged adequate water intake and limiting processed/prepared foods.  Also stressed the importance of daily exercsie   Screening for thyroid disorder -     TSH  Screening for hematuria or proteinuria -     Urinalysis, Routine w reflex microscopic   Vitamin D deficiency -     VITAMIN D 25 Hydroxy (Vit-D Deficiency, Fractures) - Stressed importance of Vit D supplementation, last value was 11  Hyperlipidemia -     Lipid panel       - Stressed importance of diet and exercsie  Medication management -     CBC with Differential/Platelet -     COMPLETE METABOLIC PANEL WITH GFR -     Lipid panel -     TSH -     Hemoglobin A1c -     VITAMIN D 25 Hydroxy (Vit-D Deficiency, Fractures) -     Magnesium -     Urinalysis, Routine w reflex microscopic -     Microalbumin / creatinine urine ratio  Screening for diabetes mellitus -     Hemoglobin A1c  Acne vulgaris -     Ambulatory referral to Dermatology - Continue current treatment regimen until evaluated by derm  Environmental allergies -     Ambulatory referral to Allergy        - Continue Zyrtec daily  Discussed med's effects and SE's. Screening labs  and tests as requested with regular follow-up as recommended. Over 40 minutes of exam, counseling, chart review and critical decision making was performed  HPI  [name] presents for complete physical.  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   Patient has depression and is on medication but interested in pursuing psychotherapy and would like a therapist referral.  She does still have a moderate amount of depression symptoms but is happy with current medications.  Denies suicidal/homicidal thoughts  She is noticing itchy/irritated eyes which she believes is related to allergies. Symptoms started 2 months ago.  Occasional skin irritation and scratchy throat.  She is wanting allergy testing.  She is doing Zyrtec but not relieving symptoms.  She has acne vulgaris and was referred to dermatologist last year but never heard from dermatologist. Would like a dermatology referral.  She does workout.  BMI is There is no height or weight on file to calculate BMI., she has been working on diet and exercise. Wt Readings from Last 3 Encounters:  02/04/22 157 lb (71.2 kg)  09/24/21 155 lb (70.3 kg)  08/25/21 155 lb (70.3 kg)    Finally, patient has history of Vitamin D Deficiency not is not taking supplementation and last vitamin D  was  Lab Results  Component Value Date   VD25OH 18 (L) 04/30/2021    Last CBC was [facility] Lab Results  Component Value Date   WBC 7.3 03/17/2022   HGB 12.4 03/17/2022   HCT 37.1 03/17/2022   MCV 82.8 03/17/2022   PLT 298 03/17/2022      Current Medications:  Current Outpatient Medications on File Prior to Visit  Medication Sig Dispense Refill   buPROPion (WELLBUTRIN XL) 150 MG 24 hr tablet Take 1 tablet by mouth daily for mood , focus & concentration 90 tablet 3   doxycycline (VIBRAMYCIN) 100 MG capsule Take 1 capsule by mouth twice daily with food (Patient not taking: Reported on 04/14/2022) 60 capsule 2    hydrOXYzine (ATARAX) 50 MG tablet Take 1 tablet by mouth 3 times a day. 90 tablet 0   hydrOXYzine (ATARAX) 50 MG tablet Take 1 tablet by mouth 3 times a day. 90 tablet 0   ibuprofen (ADVIL) 600 MG tablet Take 1 tablet by mouth every 8 hours as needed. 60 tablet 1   levocetirizine (XYZAL) 5 MG tablet Take 1 tablet by mouth every evening. 30 tablet 3   methylphenidate (CONCERTA) 36 MG PO CR tablet Take 2 tablets (72 mg total) by mouth daily. 60 tablet 0   montelukast (SINGULAIR) 10 MG tablet Take 1 tablet by mouth at bedtime. 30 tablet 3   Olopatadine HCl 0.2 % SOLN Apply 1 drop to eye(s) daily as needed (itchy/watery eyes). 2.5 mL 3   omeprazole (PRILOSEC) 20 MG capsule Take 1 capsule by mouth daily to prevent heart burn & indigestion 90 capsule 1   promethazine (PHENERGAN) 25 MG tablet Take 1 tablet (25 mg total) by mouth every 6 (six) hours as needed for nausea or vomiting (can cause fatigue). Max: 4 tablets per day 30 tablet 0   rizatriptan (MAXALT) 10 MG tablet TAKE 1 TABLET BY MOUTH ONCE AS NEEDED FOR MIGRAINE, MAY REPEAT IN 2 HOURS AS NEEDED 30 tablet 2   spironolactone (ALDACTONE) 50 MG tablet Take 2 tablets by mouth daily 60 tablet 2   tretinoin (RETIN-A) 0.05 % cream Apply pea sized amount to entire face nightly. 45 g 3   Vilazodone HCl (VIIBRYD) 40 MG TABS Take 1 tablet by mouth daily. 30 tablet 0   [DISCONTINUED] escitalopram (LEXAPRO) 20 MG tablet Take      1 tablet      Daily       for Mood 90 tablet 0   Current Facility-Administered Medications on File Prior to Visit  Medication Dose Route Frequency Provider Last Rate Last Admin   etonogestrel (NEXPLANON) implant 68 mg  68 mg Subdermal Once Tamela Gammon, NP       Health Maintenance:   Immunization History  Administered Date(s) Administered   DTaP 06/14/2001, 08/16/2001, 10/11/2001, 07/11/2002, 06/22/2005   HIB (PRP-OMP) 06/14/2001, 08/16/2001, 10/11/2001, 07/11/2002   Hepatitis B 01-05-2001, 06/14/2001, 01/10/2002   IPV  06/14/2001, 08/16/2001, 01/10/2002, 06/22/2005   Influenza Nasal 06/15/2007, 10/10/2009, 06/11/2010, 07/05/2011, 08/22/2012   Influenza,Quad,Nasal, Live 07/19/2013   Influenza,inj,quad, With Preservative 06/02/2016   MMR 04/11/2002, 06/22/2005   Moderna Sars-Covid-2 Vaccination 12/04/2019, 01/18/2020   PPD Test 07/18/2020, 09/19/2020   Pneumococcal Conjugate-13 06/14/2001, 08/16/2001, 01/10/2002, 06/22/2005   Td 08/25/2021   Tdap 07/05/2011   Varicella 04/11/2002, 06/22/2005    TD/TDAP: 10/29 2012 declines today Influenza: 06/2020 Pneumovax:N/A  Prevnar 13: N/A HPV vaccines: Declines  LMP: Mirena x 2 years, no period since 11/2019 Sexually Active: no STD  testing GC/ Chlamydia/ trichomonas done 02/17/21 all negative Pap: Never- declines today MGM: N/A  Allergies:  Allergies  Allergen Reactions   Lactose Intolerance (Gi)    Medical History:  has ADD (attention deficit disorder); Auditory processing disorder; H/O tics; Acne; Depression with anxiety; Obesity (BMI 30.0-34.9); Elevated LFTs; Migraine without aura and without status migrainosus, not intractable; Other allergic rhinitis; Allergic conjunctivitis of both eyes; and Diarrhea on their problem list. Surgical History:  She  has a past surgical history that includes Wisdom tooth extraction (2019). Family History:  Her family history includes ADD / ADHD in her brother, cousin, and father; Allergic rhinitis in her father and mother; Asthma in her maternal grandmother; Autism in her cousin; Diabetes type I in her brother; Hypertension in her father; Hypothyroidism in her paternal aunt; Migraines in her father, maternal aunt, and maternal grandmother. Social History:   reports that she has never smoked. She has never used smokeless tobacco. She reports current alcohol use. She reports that she does not use drugs.  Review of Systems: Review of Systems  Constitutional:  Negative for chills and fever.  HENT:  Negative for congestion,  hearing loss, sinus pain, sore throat and tinnitus.   Eyes:  Positive for redness. Negative for blurred vision and double vision.  Respiratory:  Negative for cough, hemoptysis, sputum production, shortness of breath and wheezing.   Cardiovascular:  Negative for chest pain, palpitations and leg swelling.  Gastrointestinal:  Negative for abdominal pain, constipation, diarrhea, heartburn, nausea and vomiting.  Genitourinary:  Negative for dysuria and urgency.  Musculoskeletal:  Negative for back pain, falls, joint pain, myalgias and neck pain.  Skin:  Negative for rash.       Cystic acne of face  Neurological:  Negative for dizziness, tingling, tremors, weakness and headaches.  Endo/Heme/Allergies:  Does not bruise/bleed easily.  Psychiatric/Behavioral:  Positive for depression. Negative for suicidal ideas. The patient is not nervous/anxious and does not have insomnia.     Physical Exam: Estimated body mass index is 30.16 kg/m as calculated from the following:   Height as of 09/24/21: 5' 0.5" (1.537 m).   Weight as of 02/04/22: 157 lb (71.2 kg). There were no vitals taken for this visit. General Appearance: Well nourished, in no apparent distress.  Eyes: Noticed blinking tic. PERRLA, EOMs, conjunctiva no swelling or erythema, normal fundi and vessels.  Sinuses: No Frontal/maxillary tenderness  ENT/Mouth: Ext aud canals clear, normal light reflex with TMs without erythema, bulging. Good dentition. No erythema, swelling, or exudate on post pharynx. Tonsils not swollen or erythematous. Hearing normal.  Neck: Supple, thyroid normal. No bruits  Respiratory: Respiratory effort normal, BS equal bilaterally without rales, rhonchi, wheezing or stridor.  Cardio: RRR without murmurs, rubs or gallops. Brisk peripheral pulses without edema.  Chest: symmetric, with normal excursions and percussion.  Breasts: Symmetric, without lumps, nipple discharge, retractions.  Abdomen: Soft, nontender, no guarding,  rebound, hernias, masses, or organomegaly.  Lymphatics: Non tender without lymphadenopathy.  Genitourinary: Defer per patient refusal Musculoskeletal: Full ROM all peripheral extremities,5/5 strength, and normal gait.  Skin: Warm, dry without rashes, noticeable cystic acne of face Neuro: Cranial nerves intact, reflexes equal bilaterally. Normal muscle tone, no cerebellar symptoms. Sensation intact.  Psych: Awake and oriented X 3, normal affect, Insight and Judgment appropriate.   EKG: defer  Kerry Chisolm E  1:46 PM [facility]

## 2022-05-18 ENCOUNTER — Encounter: Payer: No Typology Code available for payment source | Admitting: Nurse Practitioner

## 2022-05-18 DIAGNOSIS — E669 Obesity, unspecified: Secondary | ICD-10-CM

## 2022-05-18 DIAGNOSIS — L700 Acne vulgaris: Secondary | ICD-10-CM

## 2022-05-18 DIAGNOSIS — Z1389 Encounter for screening for other disorder: Secondary | ICD-10-CM

## 2022-05-18 DIAGNOSIS — E559 Vitamin D deficiency, unspecified: Secondary | ICD-10-CM

## 2022-05-18 DIAGNOSIS — Z9109 Other allergy status, other than to drugs and biological substances: Secondary | ICD-10-CM

## 2022-05-18 DIAGNOSIS — Z131 Encounter for screening for diabetes mellitus: Secondary | ICD-10-CM

## 2022-05-18 DIAGNOSIS — F418 Other specified anxiety disorders: Secondary | ICD-10-CM

## 2022-05-18 DIAGNOSIS — Z1329 Encounter for screening for other suspected endocrine disorder: Secondary | ICD-10-CM

## 2022-05-18 DIAGNOSIS — F988 Other specified behavioral and emotional disorders with onset usually occurring in childhood and adolescence: Secondary | ICD-10-CM

## 2022-05-18 DIAGNOSIS — Z79899 Other long term (current) drug therapy: Secondary | ICD-10-CM

## 2022-05-18 DIAGNOSIS — E782 Mixed hyperlipidemia: Secondary | ICD-10-CM

## 2022-05-18 DIAGNOSIS — Z0001 Encounter for general adult medical examination with abnormal findings: Secondary | ICD-10-CM

## 2022-05-20 ENCOUNTER — Other Ambulatory Visit: Payer: Self-pay

## 2022-05-20 ENCOUNTER — Encounter: Payer: Self-pay | Admitting: Emergency Medicine

## 2022-05-20 ENCOUNTER — Ambulatory Visit
Admission: EM | Admit: 2022-05-20 | Discharge: 2022-05-20 | Disposition: A | Discharge status: Home / Self Care | Payer: No Typology Code available for payment source | Attending: Family Medicine | Admitting: Family Medicine

## 2022-05-20 ENCOUNTER — Other Ambulatory Visit (HOSPITAL_COMMUNITY): Payer: Self-pay

## 2022-05-20 DIAGNOSIS — R002 Palpitations: Secondary | ICD-10-CM | POA: Diagnosis present

## 2022-05-20 DIAGNOSIS — N390 Urinary tract infection, site not specified: Secondary | ICD-10-CM | POA: Insufficient documentation

## 2022-05-20 DIAGNOSIS — R42 Dizziness and giddiness: Secondary | ICD-10-CM | POA: Insufficient documentation

## 2022-05-20 LAB — POCT URINALYSIS DIP (MANUAL ENTRY)
Bilirubin, UA: NEGATIVE
Glucose, UA: NEGATIVE mg/dL
Ketones, POC UA: NEGATIVE mg/dL
Nitrite, UA: POSITIVE — AB
Protein Ur, POC: NEGATIVE mg/dL
Spec Grav, UA: 1.015 (ref 1.010–1.025)
Urobilinogen, UA: 0.2 E.U./dL
pH, UA: 6.5 (ref 5.0–8.0)

## 2022-05-20 LAB — POCT FASTING CBG KUC MANUAL ENTRY: POCT Glucose (KUC): 97 mg/dL (ref 70–99)

## 2022-05-20 MED ORDER — NITROFURANTOIN MONOHYD MACRO 100 MG PO CAPS
100.0000 mg | ORAL_CAPSULE | Freq: Two times a day (BID) | ORAL | 0 refills | Status: DC
  Filled 2022-05-20: qty 10, 5d supply, fill #0

## 2022-05-20 NOTE — ED Provider Notes (Signed)
[facility]    CSN: [id number] Arrival date & time: 05/20/22  0959      History   Chief Complaint Chief Complaint  Patient presents with   Weakness    HPI Alexandra Horton is a 21 y.o. female.   Presenting today with 1 week history of intermittent episodes of palpitations, feeling lightheaded.  Denies any chest pain, shortness of breath, mental status changes, visual changes, headaches, diaphoresis, recent illness.  States that symptoms come and go seemingly randomly and resolve spontaneously.  Not currently having any of the symptoms.  She states that she does from time to time feel the symptoms when she has caffeine or experiences anxiety but lately they have not been associated with that necessarily that she can pinpoint.  She is not trying anything for the symptoms when they occur.  She is also having about a week of cramping pain in the bladder region, urinary frequency and urgency, dysuria.  Trying cranberry juice with no relief.  Denies fever, chills, vaginal symptoms, nausea, vomiting.   Past Medical History:  Diagnosis Date   ADHD (attention deficit hyperactivity disorder)    Dr. Loura Back   Anemia     Patient Active Problem List   Diagnosis Date Noted   Diarrhea 08/25/2021   Other allergic rhinitis 07/15/2021   Allergic conjunctivitis of both eyes 07/15/2021   Migraine without aura and without status migrainosus, not intractable 11/26/2020   Elevated LFTs 03/18/2020   Obesity (BMI 30.0-34.9) 03/17/2020   Depression with anxiety 03/08/2019   Acne 11/15/2017   H/O tics 01/03/2014   Auditory processing disorder 05/04/2013   ADD (attention deficit disorder) 07/05/2011   Past Surgical History:  Procedure Laterality Date   WISDOM TOOTH EXTRACTION  2019   OB History     Gravida  0   Para  0   Term  0   Preterm  0   AB  0   Living  0      SAB  0   IAB  0   Ectopic  0   Multiple  0   Live Births  0          Home Medications    Prior to  Admission medications   Medication Sig Start Date End Date Taking? Authorizing Provider  nitrofurantoin, macrocrystal-monohydrate, (MACROBID) 100 MG capsule Take 1 capsule (100 mg total) by mouth 2 (two) times daily. 05/20/22  Yes Volney American, PA-C  buPROPion (WELLBUTRIN XL) 150 MG 24 hr tablet Take 1 tablet by mouth daily for mood , focus & concentration 11/27/21   Unk Pinto, MD  doxycycline (VIBRAMYCIN) 100 MG capsule Take 1 capsule by mouth twice daily with food Patient not taking: Reported on 04/14/2022 09/17/21     hydrOXYzine (ATARAX) 50 MG tablet Take 1 tablet by mouth 3 times a day. 02/16/22     hydrOXYzine (ATARAX) 50 MG tablet Take 1 tablet (50 mg total) by mouth 3 (three) times daily. 02/16/22     ibuprofen (ADVIL) 600 MG tablet Take 1 tablet by mouth every 8 hours as needed. 03/23/22   Tamela Gammon, NP  levocetirizine (XYZAL) 5 MG tablet Take 1 tablet by mouth every evening. 07/15/21   Garnet Sierras, DO  methylphenidate (CONCERTA) 36 MG PO CR tablet Take 2 tablets (72 mg total) by mouth daily. 09/16/21     montelukast (SINGULAIR) 10 MG tablet Take 1 tablet by mouth at bedtime. 07/15/21   Garnet Sierras, DO  Olopatadine HCl  0.2 % SOLN Apply 1 drop to eye(s) daily as needed (itchy/watery eyes). 07/15/21   Ellamae Sia, DO  omeprazole (PRILOSEC) 20 MG capsule Take 1 capsule by mouth daily to prevent heart burn & indigestion 12/26/21   Lucky Cowboy, MD  promethazine (PHENERGAN) 25 MG tablet Take 1 tablet (25 mg total) by mouth every 6 (six) hours as needed for nausea or vomiting (can cause fatigue). Max: 4 tablets per day 02/04/22   Judd Gaudier, NP  rizatriptan (MAXALT) 10 MG tablet TAKE 1 TABLET BY MOUTH ONCE AS NEEDED FOR MIGRAINE, MAY REPEAT IN 2 HOURS AS NEEDED 09/22/21 09/22/22  Raynelle Dick, NP  spironolactone (ALDACTONE) 50 MG tablet Take 2 tablets by mouth daily 09/17/21     tretinoin (RETIN-A) 0.05 % cream Apply pea sized amount to entire face nightly. 09/17/21      Vilazodone HCl (VIIBRYD) 40 MG TABS Take 1 tablet by mouth daily. 03/05/22     escitalopram (LEXAPRO) 20 MG tablet Take      1 tablet      Daily       for Mood 08/13/20 10/01/20  Lucky Cowboy, MD    Family History Family History  Problem Relation Age of Onset   Allergic rhinitis Mother    Allergic rhinitis Father    Hypertension Father    Migraines Father    ADD / ADHD Father    ADD / ADHD Brother    Diabetes type I Brother    Migraines Maternal Aunt    Hypothyroidism Paternal Aunt    Asthma Maternal Grandmother    Migraines Maternal Grandmother    Autism Cousin        Maternal 1st Cousin   ADD / ADHD Cousin        Several Paternal 1st Cousins have ADHD    Social History Social History   Tobacco Use   Smoking status: Never   Smokeless tobacco: Never  Vaping Use   Vaping Use: Some days  Substance Use Topics   Alcohol use: Yes    Comment: occasionally   Drug use: Never     Allergies   Lactose intolerance (gi)   Review of Systems Review of Systems PER HPI  Physical Exam Triage Vital Signs ED Triage Vitals [05/20/22 1048]  Enc Vitals Group     BP 116/70     Pulse Rate 98     Resp 20     Temp 99.2 F (37.3 C)     Temp Source Oral     SpO2 96 %     Weight      Height      Head Circumference      Peak Flow      Pain Score 0     Pain Loc      Pain Edu?      Excl. in GC?    Orthostatic VS for the past 24 hrs:  BP- Lying Pulse- Lying BP- Sitting Pulse- Sitting BP- Standing at 0 minutes Pulse- Standing at 0 minutes  05/20/22 1123 104/70 81 117/79 103 110/74 110    Updated Vital Signs BP 116/70 (BP Location: Right Arm)   Pulse 98   Temp 99.2 F (37.3 C) (Oral)   Resp 20   LMP 03/17/2022 (Approximate) Comment: LMP in july. pt reports changed to nexplanon at this time.  SpO2 96%   Visual Acuity Right Eye Distance:   Left Eye Distance:   Bilateral Distance:    Right Eye Near:  Left Eye Near:    Bilateral Near:     Physical Exam Vitals  and nursing note reviewed.  Constitutional:      Appearance: Normal appearance. She is not ill-appearing.  HENT:     Head: Atraumatic.     Mouth/Throat:     Mouth: Mucous membranes are moist.  Eyes:     Extraocular Movements: Extraocular movements intact.     Conjunctiva/sclera: Conjunctivae normal.     Pupils: Pupils are equal, round, and reactive to light.  Cardiovascular:     Rate and Rhythm: Normal rate and regular rhythm.     Heart sounds: Normal heart sounds.  Pulmonary:     Effort: Pulmonary effort is normal.     Breath sounds: Normal breath sounds. No wheezing or rales.  Abdominal:     General: Bowel sounds are normal. There is no distension.     Palpations: Abdomen is soft.     Tenderness: There is no abdominal tenderness. There is no right CVA tenderness, left CVA tenderness or guarding.  Musculoskeletal:        General: Normal range of motion.     Cervical back: Normal range of motion and neck supple.  Skin:    General: Skin is warm and dry.  Neurological:     General: No focal deficit present.     Mental Status: She is alert and oriented to person, place, and time.     Cranial Nerves: No cranial nerve deficit.     Motor: No weakness.     Gait: Gait normal.  Psychiatric:        Mood and Affect: Mood normal.        Thought Content: Thought content normal.        Judgment: Judgment normal.    UC Treatments / Results  Labs (all labs ordered are listed, but only abnormal results are displayed) Labs Reviewed  POCT URINALYSIS DIP (MANUAL ENTRY) - Abnormal; Notable for the following components:      Result Value   Clarity, UA cloudy (*)    Blood, UA trace-intact (*)    Nitrite, UA Positive (*)    Leukocytes, UA Large (3+) (*)    All other components within normal limits  URINE CULTURE  CBC WITH DIFFERENTIAL/PLATELET  COMPREHENSIVE METABOLIC PANEL  TSH  POCT FASTING CBG [facility] MANUAL ENTRY   EKG  Radiology No results found.  Procedures Procedures  (including critical care time)  Medications Ordered in UC Medications - No data to display  Initial Impression / Assessment and Plan / UC Course  I have reviewed the triage vital signs and the nursing notes.  Pertinent labs & imaging results that were available during my care of the patient were reviewed by me and considered in my medical decision making (see chart for details).     Vitals and exam completely benign and reassuring today, EKG normal sinus rhythm at 98 bpm without ST or T wave changes, orthostatic vital signs with slightly significant change in heart rate with positional change though she states she is well-hydrated.  Fasting glucose within normal limits.  Labs pending.  Urinalysis showing evidence of a urinary tract infection, will send out for urine culture and treat with Macrobid while awaiting these results.  Discussed close PCP follow-up if symptoms or not resolving and consideration for an event monitor as she is currently asymptomatic during this work-up.  Work note given.  Discussed supportive care return precautions.  Final Clinical Impressions(s) / UC Diagnoses  Final diagnoses:  Palpitations  Acute lower UTI  Dizziness   Discharge Instructions   None    ED Prescriptions     Medication Sig Dispense Auth. Provider   nitrofurantoin, macrocrystal-monohydrate, (MACROBID) 100 MG capsule Take 1 capsule (100 mg total) by mouth 2 (two) times daily. 10 capsule Particia Nearing, [place]      PDMP not reviewed this encounter.   Particia Nearing, [place] 05/20/22 1346

## 2022-05-20 NOTE — ED Triage Notes (Signed)
Pt reports dizziness and feeling like heart racing intermittently x1 week. Pt denies chest pain, shortness of breath.  NAD noted. Pt denies any drug use, changes in medication. Reports history of similar with anxiety and caffeine use.    Pt also reports bladder cramping,urinary urgency around the time that "heart racing started." Pt reports even with cranberry pills/juice symptoms persist.

## 2022-05-21 ENCOUNTER — Other Ambulatory Visit (HOSPITAL_COMMUNITY): Payer: Self-pay

## 2022-05-21 LAB — CBC WITH DIFFERENTIAL/PLATELET
Basophils Absolute: 0 10*3/uL (ref 0.0–0.2)
Basos: 1 %
EOS (ABSOLUTE): 0.1 10*3/uL (ref 0.0–0.4)
Eos: 1 %
Hematocrit: 39 % (ref 34.0–46.6)
Hemoglobin: 13.2 g/dL (ref 11.1–15.9)
Immature Grans (Abs): 0 10*3/uL (ref 0.0–0.1)
Immature Granulocytes: 0 %
Lymphocytes Absolute: 3.4 10*3/uL — ABNORMAL HIGH (ref 0.7–3.1)
Lymphs: 40 %
MCH: 28.2 pg (ref 26.6–33.0)
MCHC: 33.8 g/dL (ref 31.5–35.7)
MCV: 83 fL (ref 79–97)
Monocytes Absolute: 0.8 10*3/uL (ref 0.1–0.9)
Monocytes: 9 %
Neutrophils Absolute: 4.2 10*3/uL (ref 1.4–7.0)
Neutrophils: 49 %
Platelets: 351 10*3/uL (ref 150–450)
RBC: 4.68 x10E6/uL (ref 3.77–5.28)
RDW: 13.7 % (ref 11.7–15.4)
WBC: 8.6 10*3/uL (ref 3.4–10.8)

## 2022-05-21 LAB — COMPREHENSIVE METABOLIC PANEL
ALT: 21 IU/L (ref 0–32)
AST: 18 IU/L (ref 0–40)
Albumin/Globulin Ratio: 1.9 (ref 1.2–2.2)
Albumin: 4.8 g/dL (ref 4.0–5.0)
Alkaline Phosphatase: 71 IU/L (ref 44–121)
BUN/Creatinine Ratio: 22 (ref 9–23)
BUN: 16 mg/dL (ref 6–20)
Bilirubin Total: <0.2 mg/dL (ref 0.0–1.2)
CO2: 20 mmol/L (ref 20–29)
Calcium: 10.3 mg/dL — ABNORMAL HIGH (ref 8.7–10.2)
Chloride: 102 mmol/L (ref 96–106)
Creatinine, Ser: 0.74 mg/dL (ref 0.57–1.00)
Globulin, Total: 2.5 g/dL (ref 1.5–4.5)
Glucose: 92 mg/dL (ref 70–99)
Potassium: 4.2 mmol/L (ref 3.5–5.2)
Sodium: 139 mmol/L (ref 134–144)
Total Protein: 7.3 g/dL (ref 6.0–8.5)
eGFR: 118 mL/min/{1.73_m2} (ref >59)

## 2022-05-21 LAB — TSH: TSH: 1.81 u[IU]/mL (ref 0.450–4.500)

## 2022-05-22 LAB — URINE CULTURE: Culture: >=100000 — AB

## 2022-05-26 ENCOUNTER — Other Ambulatory Visit (HOSPITAL_COMMUNITY): Payer: Self-pay

## 2022-05-28 ENCOUNTER — Other Ambulatory Visit (HOSPITAL_COMMUNITY): Payer: Self-pay

## 2022-06-01 ENCOUNTER — Other Ambulatory Visit: Payer: Self-pay

## 2022-06-02 ENCOUNTER — Other Ambulatory Visit (HOSPITAL_COMMUNITY): Payer: Self-pay

## 2022-06-02 ENCOUNTER — Encounter: Payer: Self-pay | Admitting: Internal Medicine

## 2022-06-07 ENCOUNTER — Other Ambulatory Visit (HOSPITAL_COMMUNITY): Payer: Self-pay

## 2022-06-08 ENCOUNTER — Ambulatory Visit (INDEPENDENT_AMBULATORY_CARE_PROVIDER_SITE_OTHER): Payer: No Typology Code available for payment source | Admitting: Nurse Practitioner

## 2022-06-08 ENCOUNTER — Encounter: Payer: Self-pay | Admitting: Nurse Practitioner

## 2022-06-08 ENCOUNTER — Other Ambulatory Visit (HOSPITAL_COMMUNITY): Payer: Self-pay

## 2022-06-08 VITALS — BP 118/74 | HR 103 | Temp 97.7°F | Ht 61.0 in | Wt 167.4 lb

## 2022-06-08 DIAGNOSIS — F988 Other specified behavioral and emotional disorders with onset usually occurring in childhood and adolescence: Secondary | ICD-10-CM

## 2022-06-08 DIAGNOSIS — G43009 Migraine without aura, not intractable, without status migrainosus: Secondary | ICD-10-CM

## 2022-06-08 DIAGNOSIS — F418 Other specified anxiety disorders: Secondary | ICD-10-CM

## 2022-06-08 DIAGNOSIS — Z1389 Encounter for screening for other disorder: Secondary | ICD-10-CM | POA: Diagnosis not present

## 2022-06-08 DIAGNOSIS — Z1329 Encounter for screening for other suspected endocrine disorder: Secondary | ICD-10-CM

## 2022-06-08 DIAGNOSIS — L700 Acne vulgaris: Secondary | ICD-10-CM

## 2022-06-08 DIAGNOSIS — Z0000 Encounter for general adult medical examination without abnormal findings: Secondary | ICD-10-CM | POA: Diagnosis not present

## 2022-06-08 DIAGNOSIS — E559 Vitamin D deficiency, unspecified: Secondary | ICD-10-CM

## 2022-06-08 DIAGNOSIS — Z131 Encounter for screening for diabetes mellitus: Secondary | ICD-10-CM | POA: Diagnosis not present

## 2022-06-08 DIAGNOSIS — E669 Obesity, unspecified: Secondary | ICD-10-CM

## 2022-06-08 DIAGNOSIS — Z0001 Encounter for general adult medical examination with abnormal findings: Secondary | ICD-10-CM

## 2022-06-08 DIAGNOSIS — Z13220 Encounter for screening for lipoid disorders: Secondary | ICD-10-CM | POA: Diagnosis not present

## 2022-06-08 DIAGNOSIS — Z79899 Other long term (current) drug therapy: Secondary | ICD-10-CM

## 2022-06-08 DIAGNOSIS — Z9109 Other allergy status, other than to drugs and biological substances: Secondary | ICD-10-CM

## 2022-06-08 DIAGNOSIS — Z130 Encounter for screening for diseases of the blood and blood-forming organs and certain disorders involving the immune mechanism: Secondary | ICD-10-CM

## 2022-06-08 MED ORDER — VILAZODONE HCL 40 MG PO TABS
40.0000 mg | ORAL_TABLET | Freq: Every day | ORAL | 2 refills | Status: DC
  Filled 2022-06-08: qty 30, 30d supply, fill #0
  Filled 2022-07-12: qty 30, 30d supply, fill #1
  Filled 2022-08-18: qty 30, 30d supply, fill #2

## 2022-06-08 MED ORDER — SUMATRIPTAN SUCCINATE 50 MG PO TABS
50.0000 mg | ORAL_TABLET | Freq: Once | ORAL | 2 refills | Status: DC | PRN
  Filled 2022-06-08: qty 9, 30d supply, fill #0
  Filled 2022-07-15: qty 9, 30d supply, fill #1
  Filled 2022-08-18: qty 9, 30d supply, fill #2
  Filled 2022-10-12 – 2022-12-16 (×2): qty 9, 30d supply, fill #3
  Filled 2023-01-16: qty 9, 30d supply, fill #4
  Filled 2023-04-27 – 2023-05-16 (×2): qty 9, 30d supply, fill #5

## 2022-06-08 NOTE — Patient Instructions (Signed)
  Psychological Services:  [facility] [address] [phone]  [place], [place] [address] [place], [place]: [phone] or [phone], [email or web address]  [facility]  [phone], [place], [place] [address] [email or web address]   [facility] - [address] [place], [place] [address] - [P] [phone] Mobile Crisis Teams:  [facility]  Mobile Crisis Care Unit  [phone]  Assertive  Psychotherapeutic Services  [address]  [place]  [address]  [place]  [phone]  Self-Help/Support Groups:  [place] [employer] of support groups  [phone] (call for more info)

## 2022-06-08 NOTE — Progress Notes (Signed)
Complete Physical  Assessment and Plan:  Alexandra Horton was seen today for annual exam.  Diagnoses and all orders for this visit:  Encounter for routine adult health examination without abnormal findings Due Annually Refused Pap - sexually active with women.  Plans to get at GYN Refused STD screening.   Attention deficit disorder (ADD) without hyperactivity Continue Concerta CR 82XH 1-2 tabs daily Monitor PDMP  Depression with anxiety Continue Wellbutrin and Viibryd (refilled). Continue to search for new Psychiatrist-counseling services provided in [facility].  Obesity (BMI 30-39.9) Discussed appropriate BMI Goal of losing 1 lb per month. Diet modification. Physical activity. Encouraged/praised to build confidence.   Screening for deficiency anemia -     CBC with Differential/Platelet  Screening for thyroid disorder -     TSH  Screening for hematuria or proteinuria -     Urinalysis, Routine w reflex microscopic -     Microalbumin / creatinine urine ratio  Vitamin D deficiency -     VITAMIN D 25 Hydroxy (Vit-D Deficiency, Fractures)   Screening, lipid Discussed lifestyle modifications. Recommended diet heavy in fruits and veggies, omega 3's. Decrease consumption of animal meats, cheeses, and dairy products. Remain active and exercise as tolerated. Continue to monitor. Check lipids/TSH   Medication management All medications discussed and reviewed in full. All questions and concerns regarding medications addressed.     Screening for diabetes mellitus -     Hemoglobin A1c  Acne vulgaris Request for new Dermatology referral to [facility]. Continue current treatment regimen until evaluated by derm  Environmental allergies Continue to follow with [facility] Continue Zyrtec daily, avoid triggers  Migraine Discussed likely d/t new placement of Nexplanon and hormonal changes Letter provided to work place - may need FMLA. Stop  Rizatriptan/Maxalt. Start Sumatriptan Stay well hydrated  Orders Placed This Encounter  Procedures   CBC with Differential/Platelet   COMPLETE METABOLIC PANEL WITH GFR   Lipid panel   TSH   Hemoglobin A1c   VITAMIN D 25 Hydroxy (Vit-D Deficiency, Fractures)   Urinalysis, Routine w reflex microscopic   Microalbumin / creatinine urine ratio   Ambulatory referral to Dermatology    Referral Priority:   Routine    Referral Type:   Consultation    Referral Reason:   Specialty Services Required    Requested Specialty:   Dermatology    Number of Visits Requested:   1     Meds ordered this encounter  Medications   Vilazodone HCl (VIIBRYD) 40 MG TABS    Sig: Take 1 tablet by mouth daily.    Dispense:  30 tablet    Refill:  2   SUMAtriptan (IMITREX) 50 MG tablet    Sig: Take 1 tablet (50 mg total) by mouth once as needed for migraine. May repeat in 2 hours if headache persists or recurs.    Dispense:  30 tablet    Refill:  2    Order Specific Question:   Supervising Provider    Answer:   Unk Pinto [phone]    Notify office for further evaluation and treatment, questions or concerns if any reported s/s fail to improve.   The patient was advised to call back or seek an in-person evaluation if any symptoms worsen or if the condition fails to improve as anticipated.   Further disposition pending results of labs. Discussed med's effects and SE's.    I discussed the assessment and treatment plan with the patient. The patient was provided an opportunity to ask questions  and all were answered. The patient agreed with the plan and demonstrated an understanding of the instructions.  Discussed med's effects and SE's. Screening labs and tests as requested with regular follow-up as recommended.  I provided 30 minutes of face-to-face time during this encounter including counseling, chart review, and critical decision making was preformed.   HPI  [name] presents  for complete physical.  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   Overall Alexandra Horton reports feeling well.    Alexandra Horton is no longer seeing a Psychiatrist.  Alexandra Horton has not had Alexandra Horton Viibryd for 1 month.  This has caused Alexandra Horton to be more irritable and angry.  Alexandra Horton is continuing the Wellbutrin.  Alexandra Horton does not take the Lexapro.  Alexandra Horton is trying to find a new therapist/psychiatrist.  Denies suicidal/homicidal thoughts  Alexandra Horton has been taking Zyrtec PRN for allergies.  Was referred to specialist and attends PRN.  Currently well controlled.  Alexandra Horton has acne vulgaris and was referred to dermatologist that started tmt with Doxycycline and Tretinoin but Alexandra Horton feels as though this is not working.  Alexandra Horton continues to have cystic acne, most notably around cheek area, now has acne on the arms and AC joint area.  Alexandra Horton is requesting to be referred to [facility] to which Alexandra Horton reports having treatment with in the past as a child.   Alexandra Horton is deferring pap today along with STD testing.  Alexandra Horton is monogamous with a female partner.  Has not had intercourse.  Alexandra Horton does follow with [facility].  Had IUD removed an Nexplanon placed.  Alexandra Horton feels this has been contributing to Alexandra Horton mood swings and increase in migraine HA.  Reports dx of endometriosis.  Alexandra Horton is requesting to have further work up/pap through [employer].  Alexandra Horton was once on Maxalt but this is no longer working to control Alexandra Horton HA symptoms.  Alexandra Horton is trying to stay well hydrated.  Alexandra Horton has missed worked several times last month d/t migraine HA uncontrolled by Maxalt, associated N/V, photophobia.    Alexandra Horton does workout.  BMI is There is no height or weight on file to calculate BMI., Alexandra Horton has been working on diet and exercise. Wt Readings from Last 3 Encounters:  02/04/22 157 lb (71.2 kg)  09/24/21 155 lb (70.3 kg)  08/25/21 155 lb (70.3 kg)    Finally, patient has history of Vitamin D Deficiency not is not taking supplementation and last vitamin D was  Lab Results  Component  Value Date   VD25OH 18 (L) 04/30/2021    Last CBC was [facility] Lab Results  Component Value Date   WBC 8.6 05/20/2022   HGB 13.2 05/20/2022   HCT 39.0 05/20/2022   MCV 83 05/20/2022   PLT 351 05/20/2022      Current Medications:  Current Outpatient Medications on File Prior to Visit  Medication Sig Dispense Refill   buPROPion (WELLBUTRIN XL) 150 MG 24 hr tablet Take 1 tablet by mouth daily for mood , focus & concentration 90 tablet 3   doxycycline (VIBRAMYCIN) 100 MG capsule Take 1 capsule by mouth twice daily with food (Patient not taking: Reported on 04/14/2022) 60 capsule 2   hydrOXYzine (ATARAX) 50 MG tablet Take 1 tablet by mouth 3 times a day. 90 tablet 0   hydrOXYzine (ATARAX) 50 MG tablet Take 1 tablet (50 mg total) by mouth 3 (three) times daily. 90 tablet 0   ibuprofen (ADVIL) 600 MG tablet Take 1 tablet by mouth  every 8 hours as needed. 60 tablet 1   levocetirizine (XYZAL) 5 MG tablet Take 1 tablet by mouth every evening. 30 tablet 3   methylphenidate (CONCERTA) 36 MG PO CR tablet Take 2 tablets (72 mg total) by mouth daily. 60 tablet 0   montelukast (SINGULAIR) 10 MG tablet Take 1 tablet by mouth at bedtime. 30 tablet 3   nitrofurantoin, macrocrystal-monohydrate, (MACROBID) 100 MG capsule Take 1 capsule (100 mg total) by mouth 2 (two) times daily. 10 capsule 0   Olopatadine HCl 0.2 % SOLN Apply 1 drop to eye(s) daily as needed (itchy/watery eyes). 2.5 mL 3   omeprazole (PRILOSEC) 20 MG capsule Take 1 capsule by mouth daily to prevent heart burn & indigestion 90 capsule 1   promethazine (PHENERGAN) 25 MG tablet Take 1 tablet (25 mg total) by mouth every 6 (six) hours as needed for nausea or vomiting (can cause fatigue). Max: 4 tablets per day 30 tablet 0   rizatriptan (MAXALT) 10 MG tablet TAKE 1 TABLET BY MOUTH ONCE AS NEEDED FOR MIGRAINE, MAY REPEAT IN 2 HOURS AS NEEDED 30 tablet 2   spironolactone (ALDACTONE) 50 MG tablet Take 2 tablets by mouth daily 60 tablet 2   tretinoin  (RETIN-A) 0.05 % cream Apply pea sized amount to entire face nightly. 45 g 3   Vilazodone HCl (VIIBRYD) 40 MG TABS Take 1 tablet by mouth daily. 30 tablet 0   [DISCONTINUED] escitalopram (LEXAPRO) 20 MG tablet Take      1 tablet      Daily       for Mood 90 tablet 0   Current Facility-Administered Medications on File Prior to Visit  Medication Dose Route Frequency Provider Last Rate Last Admin   etonogestrel (NEXPLANON) implant 68 mg  68 mg Subdermal Once Tamela Gammon, NP       Health Maintenance:   Immunization History  Administered Date(s) Administered   DTaP 06/14/2001, 08/16/2001, 10/11/2001, 07/11/2002, 06/22/2005   HIB (PRP-OMP) 06/14/2001, 08/16/2001, 10/11/2001, 07/11/2002   Hepatitis B 10-19-2000, 06/14/2001, 01/10/2002   IPV 06/14/2001, 08/16/2001, 01/10/2002, 06/22/2005   Influenza Nasal 06/15/2007, 10/10/2009, 06/11/2010, 07/05/2011, 08/22/2012   Influenza,Quad,Nasal, Live 07/19/2013   Influenza,inj,quad, With Preservative 06/02/2016   MMR 04/11/2002, 06/22/2005   Moderna Sars-Covid-2 Vaccination 12/04/2019, 01/18/2020   PPD Test 07/18/2020, 09/19/2020   Pneumococcal Conjugate-13 06/14/2001, 08/16/2001, 01/10/2002, 06/22/2005   Td 08/25/2021   Tdap 07/05/2011   Varicella 04/11/2002, 06/22/2005    TD/TDAP: 10/29 2012 declines today Influenza: Due 06/2022 Pneumovax:N/A  Prevnar 13: N/A HPV vaccines: Declines  LMP: Mirena x 2 years, removed. No period since 11/2019 - Nexplanon 03/2022 Sexually Active: with female partner.  STD testing GC/ Chlamydia/ trichomonas done 02/17/21 all negative - declines update STD today Pap: Never- declines today MGM: N/A  Allergies:  Allergies  Allergen Reactions   Lactose Intolerance (Gi)    Medical History:  has ADD (attention deficit disorder); Auditory processing disorder; H/O tics; Acne; Depression with anxiety; Obesity (BMI 30.0-34.9); Elevated LFTs; Migraine without aura and without status migrainosus, not intractable; Other  allergic rhinitis; Allergic conjunctivitis of both eyes; and Diarrhea on their problem list. Surgical History:  Alexandra Horton  has a past surgical history that includes Wisdom tooth extraction (2019). Family History:  Alexandra Horton family history includes ADD / ADHD in Alexandra Horton brother, cousin, and father; Allergic rhinitis in Alexandra Horton father and mother; Asthma in Alexandra Horton maternal grandmother; Autism in Alexandra Horton cousin; Diabetes type I in Alexandra Horton brother; Hypertension in Alexandra Horton father; Hypothyroidism in Alexandra Horton paternal aunt;  Migraines in Alexandra Horton father, maternal aunt, and maternal grandmother. Social History:   reports that Alexandra Horton has never smoked. Alexandra Horton has never used smokeless tobacco. Alexandra Horton reports current alcohol use. Alexandra Horton reports that Alexandra Horton does not use drugs.  Review of Systems: Review of Systems  Constitutional:  Negative for chills and fever.  HENT:  Negative for congestion, hearing loss, sinus pain, sore throat and tinnitus.   Eyes:  Positive for redness. Negative for blurred vision and double vision.  Respiratory:  Negative for cough, hemoptysis, sputum production, shortness of breath and wheezing.   Cardiovascular:  Negative for chest pain, palpitations and leg swelling.  Gastrointestinal:  Negative for abdominal pain, constipation, diarrhea, heartburn, nausea and vomiting.  Genitourinary:  Negative for dysuria and urgency.  Musculoskeletal:  Negative for back pain, falls, joint pain, myalgias and neck pain.  Skin:  Negative for rash.       Cystic acne of face  Neurological:  Negative for dizziness, tingling, tremors, weakness and headaches.  Endo/Heme/Allergies:  Does not bruise/bleed easily.  Psychiatric/Behavioral:  Positive for depression. Negative for suicidal ideas. The patient is not nervous/anxious and does not have insomnia.     Physical Exam: Estimated body mass index is 30.16 kg/m as calculated from the following:   Height as of 09/24/21: 5' 0.5" (1.537 m).   Weight as of 02/04/22: 157 lb (71.2 kg). LMP 03/17/2022 (Approximate)  Comment: LMP in july. pt reports changed to nexplanon at this time. General Appearance: Well nourished, in no apparent distress.  Eyes: Noticed blinking tic. PERRLA, EOMs, conjunctiva no swelling or erythema, normal fundi and vessels.  Sinuses: No Frontal/maxillary tenderness  ENT/Mouth: Ext aud canals clear, normal light reflex with TMs without erythema, bulging. Good dentition. No erythema, swelling, or exudate on post pharynx. Tonsils not swollen or erythematous. Hearing normal.  Neck: Supple, thyroid normal. No bruits  Respiratory: Respiratory effort normal, BS equal bilaterally without rales, rhonchi, wheezing or stridor.  Cardio: RRR without murmurs, rubs or gallops. Brisk peripheral pulses without edema.  Chest: symmetric, with normal excursions and percussion.  Breasts: Symmetric, without lumps, nipple discharge, retractions.  Abdomen: Soft, nontender, no guarding, rebound, hernias, masses, or organomegaly.  Lymphatics: Non tender without lymphadenopathy.  Genitourinary: Defer per patient refusal Musculoskeletal: Full ROM all peripheral extremities,5/5 strength, and normal gait.  Skin: Warm, dry without rashes, noticeable cystic acne of face Neuro: Cranial nerves intact, reflexes equal bilaterally. Normal muscle tone, no cerebellar symptoms. Sensation intact.  Psych: Awake and oriented X 3, normal affect, Insight and Judgment appropriate.   EKG: defer  Elidia Bonenfant 8:41 AM [facility]

## 2022-06-09 LAB — CBC WITH DIFFERENTIAL/PLATELET
Absolute Monocytes: 823 cells/uL (ref 200–950)
Basophils Absolute: 42 cells/uL (ref 0–200)
Basophils Relative: 0.5 %
Eosinophils Absolute: 630 cells/uL — ABNORMAL HIGH (ref 15–500)
Eosinophils Relative: 7.5 %
HCT: 36.6 % (ref 35.0–45.0)
Hemoglobin: 12.3 g/dL (ref 11.7–15.5)
Lymphs Abs: 2932 cells/uL (ref 850–3900)
MCH: 27.6 pg (ref 27.0–33.0)
MCHC: 33.6 g/dL (ref 32.0–36.0)
MCV: 82.2 fL (ref 80.0–100.0)
MPV: 10.3 fL (ref 7.5–12.5)
Monocytes Relative: 9.8 %
Neutro Abs: 3973 cells/uL (ref 1500–7800)
Neutrophils Relative %: 47.3 %
Platelets: 322 10*3/uL (ref 140–400)
RBC: 4.45 10*6/uL (ref 3.80–5.10)
RDW: 13.2 % (ref 11.0–15.0)
Total Lymphocyte: 34.9 %
WBC: 8.4 10*3/uL (ref 3.8–10.8)

## 2022-06-09 LAB — COMPLETE METABOLIC PANEL WITH GFR
AG Ratio: 1.8 (calc) (ref 1.0–2.5)
ALT: 23 U/L (ref 6–29)
AST: 16 U/L (ref 10–30)
Albumin: 4.4 g/dL (ref 3.6–5.1)
Alkaline phosphatase (APISO): 59 U/L (ref 31–125)
BUN: 14 mg/dL (ref 7–25)
CO2: 23 mmol/L (ref 20–32)
Calcium: 9.7 mg/dL (ref 8.6–10.2)
Chloride: 108 mmol/L (ref 98–110)
Creat: 0.92 mg/dL (ref 0.50–0.96)
Globulin: 2.5 g/dL (calc) (ref 1.9–3.7)
Glucose, Bld: 96 mg/dL (ref 65–99)
Potassium: 4 mmol/L (ref 3.5–5.3)
Sodium: 140 mmol/L (ref 135–146)
Total Bilirubin: 0.2 mg/dL (ref 0.2–1.2)
Total Protein: 6.9 g/dL (ref 6.1–8.1)
eGFR: 91 mL/min/{1.73_m2} (ref >=60)

## 2022-06-09 LAB — LIPID PANEL
Cholesterol: 173 mg/dL (ref <200)
HDL: 45 mg/dL — ABNORMAL LOW (ref >=50)
LDL Cholesterol (Calc): 104 mg/dL (calc) — ABNORMAL HIGH
Non-HDL Cholesterol (Calc): 128 mg/dL (calc) (ref <130)
Total CHOL/HDL Ratio: 3.8 (calc) (ref <5.0)
Triglycerides: 140 mg/dL (ref <150)

## 2022-06-09 LAB — URINALYSIS, ROUTINE W REFLEX MICROSCOPIC
Bilirubin Urine: NEGATIVE
Glucose, UA: NEGATIVE
Hgb urine dipstick: NEGATIVE
Hyaline Cast: NONE SEEN /LPF
Ketones, ur: NEGATIVE
Nitrite: NEGATIVE
Protein, ur: NEGATIVE
RBC / HPF: NONE SEEN /HPF (ref 0–2)
Specific Gravity, Urine: 1.024 (ref 1.001–1.035)
pH: 6.5 (ref 5.0–8.0)

## 2022-06-09 LAB — MICROSCOPIC MESSAGE

## 2022-06-09 LAB — HEMOGLOBIN A1C
Hgb A1c MFr Bld: 5.4 % of total Hgb (ref <5.7)
Mean Plasma Glucose: 108 mg/dL
eAG (mmol/L): 6 mmol/L

## 2022-06-09 LAB — TSH: TSH: 1.36 mIU/L

## 2022-06-09 LAB — VITAMIN D 25 HYDROXY (VIT D DEFICIENCY, FRACTURES): Vit D, 25-Hydroxy: 21 ng/mL — ABNORMAL LOW (ref 30–100)

## 2022-06-09 LAB — MICROALBUMIN / CREATININE URINE RATIO
Creatinine, Urine: 202 mg/dL (ref 20–275)
Microalb Creat Ratio: 6 mcg/mg creat (ref <30)
Microalb, Ur: 1.2 mg/dL

## 2022-06-11 ENCOUNTER — Other Ambulatory Visit: Payer: Self-pay | Admitting: Adult Health

## 2022-06-11 ENCOUNTER — Other Ambulatory Visit (HOSPITAL_COMMUNITY): Payer: Self-pay

## 2022-06-11 DIAGNOSIS — R112 Nausea with vomiting, unspecified: Secondary | ICD-10-CM

## 2022-06-11 MED ORDER — PROMETHAZINE HCL 25 MG PO TABS
25.0000 mg | ORAL_TABLET | Freq: Four times a day (QID) | ORAL | 0 refills | Status: DC | PRN
  Filled 2022-06-11: qty 90, 23d supply, fill #0

## 2022-06-12 ENCOUNTER — Other Ambulatory Visit (HOSPITAL_COMMUNITY): Payer: Self-pay

## 2022-06-18 ENCOUNTER — Other Ambulatory Visit (HOSPITAL_COMMUNITY): Payer: Self-pay

## 2022-06-20 ENCOUNTER — Encounter (HOSPITAL_BASED_OUTPATIENT_CLINIC_OR_DEPARTMENT_OTHER): Payer: Self-pay

## 2022-06-20 ENCOUNTER — Other Ambulatory Visit: Payer: Self-pay

## 2022-06-20 ENCOUNTER — Emergency Department (HOSPITAL_BASED_OUTPATIENT_CLINIC_OR_DEPARTMENT_OTHER): Payer: No Typology Code available for payment source | Admitting: Radiology

## 2022-06-20 DIAGNOSIS — Z1152 Encounter for screening for COVID-19: Secondary | ICD-10-CM | POA: Insufficient documentation

## 2022-06-20 DIAGNOSIS — R110 Nausea: Secondary | ICD-10-CM | POA: Diagnosis not present

## 2022-06-20 DIAGNOSIS — Z5321 Procedure and treatment not carried out due to patient leaving prior to being seen by health care provider: Secondary | ICD-10-CM | POA: Diagnosis not present

## 2022-06-20 DIAGNOSIS — R1031 Right lower quadrant pain: Secondary | ICD-10-CM | POA: Diagnosis present

## 2022-06-20 DIAGNOSIS — R1032 Left lower quadrant pain: Secondary | ICD-10-CM | POA: Insufficient documentation

## 2022-06-20 LAB — COMPREHENSIVE METABOLIC PANEL
ALT: 18 U/L (ref 0–44)
AST: 16 U/L (ref 15–41)
Albumin: 4.6 g/dL (ref 3.5–5.0)
Alkaline Phosphatase: 61 U/L (ref 38–126)
Anion gap: 8 (ref 5–15)
BUN: 16 mg/dL (ref 6–20)
CO2: 22 mmol/L (ref 22–32)
Calcium: 10 mg/dL (ref 8.9–10.3)
Chloride: 109 mmol/L (ref 98–111)
Creatinine, Ser: 0.98 mg/dL (ref 0.44–1.00)
GFR, Estimated: >60 mL/min (ref >60)
Glucose, Bld: 88 mg/dL (ref 70–99)
Potassium: 3.4 mmol/L — ABNORMAL LOW (ref 3.5–5.1)
Sodium: 139 mmol/L (ref 135–145)
Total Bilirubin: 0.2 mg/dL — ABNORMAL LOW (ref 0.3–1.2)
Total Protein: 7.7 g/dL (ref 6.5–8.1)

## 2022-06-20 LAB — CBC
HCT: 38.5 % (ref 36.0–46.0)
Hemoglobin: 12.9 g/dL (ref 12.0–15.0)
MCH: 27.1 pg (ref 26.0–34.0)
MCHC: 33.5 g/dL (ref 30.0–36.0)
MCV: 80.9 fL (ref 80.0–100.0)
Platelets: 303 10*3/uL (ref 150–400)
RBC: 4.76 MIL/uL (ref 3.87–5.11)
RDW: 13.2 % (ref 11.5–15.5)
WBC: 9.7 10*3/uL (ref 4.0–10.5)
nRBC: 0 % (ref 0.0–0.2)

## 2022-06-20 LAB — URINALYSIS, ROUTINE W REFLEX MICROSCOPIC
Bilirubin Urine: NEGATIVE
Glucose, UA: NEGATIVE mg/dL
Hgb urine dipstick: NEGATIVE
Ketones, ur: NEGATIVE mg/dL
Nitrite: NEGATIVE
Protein, ur: NEGATIVE mg/dL
Specific Gravity, Urine: 1.022 (ref 1.005–1.030)
pH: 6 (ref 5.0–8.0)

## 2022-06-20 LAB — LIPASE, BLOOD: Lipase: 26 U/L (ref 11–51)

## 2022-06-20 LAB — RESP PANEL BY RT-PCR (FLU A&B, COVID) ARPGX2
Influenza A by PCR: NEGATIVE
Influenza B by PCR: NEGATIVE
SARS Coronavirus 2 by RT PCR: NEGATIVE

## 2022-06-20 LAB — GROUP A STREP BY PCR: Group A Strep by PCR: NOT DETECTED

## 2022-06-20 LAB — HCG, SERUM, QUALITATIVE: Preg, Serum: NEGATIVE

## 2022-06-20 LAB — TROPONIN I (HIGH SENSITIVITY): Troponin I (High Sensitivity): <2 ng/L (ref <18)

## 2022-06-20 MED ORDER — ONDANSETRON 4 MG PO TBDP: 4.0000 mg | ORAL_TABLET | Freq: Once | ORAL | Status: DC

## 2022-06-20 NOTE — ED Triage Notes (Signed)
Patient here POV from Home.  Endorses Bilateral Lower ABD Pain that began approximately 1 Hour ago. Radiating towards center ABD and Chest as well.  History of Endometriosis. Some nausea. No Emesis or Diarrhea. No fevers.   NAD Noted during Triage. A&Ox4. GCS 15. Ambulatory.

## 2022-06-21 ENCOUNTER — Emergency Department (HOSPITAL_BASED_OUTPATIENT_CLINIC_OR_DEPARTMENT_OTHER)
Admission: EM | Admit: 2022-06-21 | Discharge: 2022-06-21 | Discharge status: Against Medical Advice | Payer: No Typology Code available for payment source | Attending: Emergency Medicine | Admitting: Emergency Medicine

## 2022-07-02 ENCOUNTER — Encounter: Payer: Self-pay | Admitting: Nurse Practitioner

## 2022-07-02 ENCOUNTER — Ambulatory Visit (INDEPENDENT_AMBULATORY_CARE_PROVIDER_SITE_OTHER): Payer: No Typology Code available for payment source | Admitting: Nurse Practitioner

## 2022-07-02 ENCOUNTER — Other Ambulatory Visit (HOSPITAL_COMMUNITY): Payer: Self-pay

## 2022-07-02 VITALS — BP 110/80 | HR 108 | Temp 97.7°F | Ht 61.0 in | Wt 164.0 lb

## 2022-07-02 DIAGNOSIS — R1084 Generalized abdominal pain: Secondary | ICD-10-CM | POA: Diagnosis not present

## 2022-07-02 DIAGNOSIS — Z79899 Other long term (current) drug therapy: Secondary | ICD-10-CM | POA: Diagnosis not present

## 2022-07-02 DIAGNOSIS — Z09 Encounter for follow-up examination after completed treatment for conditions other than malignant neoplasm: Secondary | ICD-10-CM

## 2022-07-02 DIAGNOSIS — E876 Hypokalemia: Secondary | ICD-10-CM | POA: Diagnosis not present

## 2022-07-02 MED ORDER — METHYLPHENIDATE HCL ER (OSM) 36 MG PO TBCR
72.0000 mg | EXTENDED_RELEASE_TABLET | Freq: Every day | ORAL | 0 refills | Status: DC
  Filled 2022-07-02: qty 60, 30d supply, fill #0

## 2022-07-02 NOTE — Progress Notes (Signed)
.Hospital follow up  Assessment and Plan: Hospital visit follow up for:   1. Hospital discharge follow-up Reviewed hospital encounter, labs, diagnostics, medications. All questions and concerns addressed  2. Generalized abdominal pain Resolved. Continue to monitor  3. Low serum potassium  - Potassium  4. Medication management All medications discussed and reviewed in full. All questions and concerns regarding medications addressed.     All medications were reviewed with patient and family and fully reconciled. All questions answered fully, and patient and family members were encouraged to call the office with any further questions or concerns. Discussed goal to avoid readmission related to this diagnosis.  Medications Discontinued During This Encounter  Medication Reason   methylphenidate (CONCERTA) 36 MG PO CR tablet Reorder    Over 30 minutes of exam, counseling, chart review, and complex, high/moderate level critical decision making was performed this visit.   Future Appointments  Date Time Provider [place]  09/28/2022  8:00 AM Marny Lowenstein A, NP [facility] None  06/09/2023 10:00 AM Darrol Jump, NP [facility] None     HPI 21 y.o.female presents for follow up for transition from recent ER visit. Admit date to the ER was 06/21/22, patient was discharged from the hospital on 06/21/22 and our clinical staff contacted the office the day after discharge to set up a follow up appointment. The discharge summary, medications, and diagnostic test results were reviewed before meeting with the patient. The patient was admitted for: abdominal pain.  She has a hx of endometriosis.  Work up was negative for any serious disease state, including CXR and blood work.  She was noted to have  a slightly low potassium level.  Abdominal pain is now resolved.  Denies N/V, fever, chills. She reports feeling well overall and back to baseline.   Images while in the hospital: No  results found.    Current Facility-Administered Medications (Endocrine & Metabolic):    etonogestrel (NEXPLANON) implant 68 mg  Current Outpatient Medications (Cardiovascular):    spironolactone (ALDACTONE) 50 MG tablet, Take 2 tablets by mouth daily (Patient not taking: Reported on 07/02/2022)   Current Outpatient Medications (Respiratory):    levocetirizine (XYZAL) 5 MG tablet, Take 1 tablet by mouth every evening.   montelukast (SINGULAIR) 10 MG tablet, Take 1 tablet by mouth at bedtime.   promethazine (PHENERGAN) 25 MG tablet, Take 1 tablet (25 mg total) by mouth every 6 (six) hours as needed for nausea or vomiting (can cause fatigue). Max: 4 tablets per day   Current Outpatient Medications (Analgesics):    SUMAtriptan (IMITREX) 50 MG tablet, Take 1 tablet (50 mg total) by mouth once as needed for migraine. May repeat in 2 hours if headache persists or recurs.     Current Outpatient Medications (Other):    buPROPion (WELLBUTRIN XL) 150 MG 24 hr tablet, Take 1 tablet by mouth daily for mood , focus & concentration   hydrOXYzine (ATARAX) 50 MG tablet, Take 1 tablet by mouth 3 times a day.   Olopatadine HCl 0.2 % SOLN, Apply 1 drop to eye(s) daily as needed (itchy/watery eyes).   Vilazodone HCl (VIIBRYD) 40 MG TABS, Take 1 tablet by mouth daily.   doxycycline (VIBRAMYCIN) 100 MG capsule, Take 1 capsule by mouth twice daily with food   hydrOXYzine (ATARAX) 50 MG tablet, Take 1 tablet (50 mg total) by mouth 3 (three) times daily.   methylphenidate (CONCERTA) 36 MG PO CR tablet, Take 2 tablets (72 mg total) by mouth daily.   nitrofurantoin, macrocrystal-monohydrate, (MACROBID)  100 MG capsule, Take 1 capsule (100 mg total) by mouth 2 (two) times daily.   omeprazole (PRILOSEC) 20 MG capsule, Take 1 capsule by mouth daily to prevent heart burn & indigestion (Patient not taking: Reported on 06/08/2022)   tretinoin (RETIN-A) 0.05 % cream, Apply pea sized amount to entire face nightly.  (Patient not taking: Reported on 07/02/2022)   Past Medical History:  Diagnosis Date   ADHD (attention deficit hyperactivity disorder)    Dr. Loura Back   Anemia      Allergies  Allergen Reactions   Lactose Intolerance (Gi)     ROS: all negative except above.   Physical Exam: Filed Weights   07/02/22 0921  Weight: 164 lb (74.4 kg)   BP 110/80   Pulse (!) 108   Temp 97.7 F (36.5 C)   Ht 5\' 1"  (1.549 m)   Wt 164 lb (74.4 kg)   SpO2 99%   BMI 30.99 kg/m  General Appearance: Well nourished, in no apparent distress. Eyes: PERRLA, EOMs, conjunctiva no swelling or erythema Sinuses: No Frontal/maxillary tenderness ENT/Mouth: Ext aud canals clear, TMs without erythema, bulging. No erythema, swelling, or exudate on post pharynx.  Tonsils not swollen or erythematous. Hearing normal.  Neck: Supple, thyroid normal.  Respiratory: Respiratory effort normal, BS equal bilaterally without rales, rhonchi, wheezing or stridor.  Cardio: RRR with no MRGs. Brisk peripheral pulses without edema.  Abdomen: Soft, + BS.  Non tender, no guarding, rebound, hernias, masses. Lymphatics: Non tender without lymphadenopathy.  Musculoskeletal: Full ROM, 5/5 strength, normal gait.  Skin: Warm, dry without rashes, lesions, ecchymosis.  Neuro: Cranial nerves intact. Normal muscle tone, no cerebellar symptoms. Sensation intact.  Psych: Awake and oriented X 3, normal affect, Insight and Judgment appropriate.     Darrol Jump, NP 10:00 AM [facility]

## 2022-07-03 LAB — POTASSIUM: Potassium: 4.1 mmol/L (ref 3.5–5.3)

## 2022-07-13 ENCOUNTER — Other Ambulatory Visit (HOSPITAL_COMMUNITY): Payer: Self-pay

## 2022-07-16 ENCOUNTER — Other Ambulatory Visit (HOSPITAL_COMMUNITY): Payer: Self-pay

## 2022-07-26 ENCOUNTER — Encounter: Payer: Self-pay | Admitting: Nurse Practitioner

## 2022-07-26 ENCOUNTER — Ambulatory Visit (INDEPENDENT_AMBULATORY_CARE_PROVIDER_SITE_OTHER): Payer: No Typology Code available for payment source | Admitting: Nurse Practitioner

## 2022-07-26 VITALS — BP 118/84 | HR 103 | Temp 97.9°F | Ht 61.0 in | Wt 171.8 lb

## 2022-07-26 DIAGNOSIS — F439 Reaction to severe stress, unspecified: Secondary | ICD-10-CM | POA: Diagnosis not present

## 2022-07-26 DIAGNOSIS — L509 Urticaria, unspecified: Secondary | ICD-10-CM | POA: Diagnosis not present

## 2022-07-26 MED ORDER — DEXAMETHASONE SODIUM PHOSPHATE 10 MG/ML IJ SOLN
10.0000 mg | Freq: Once | INTRAMUSCULAR | Status: AC
  Administered 2022-07-26: 10 mg via INTRAMUSCULAR

## 2022-07-26 NOTE — Progress Notes (Signed)
Assessment and Plan:  Alexandra Horton was seen today for an episodic visit.  Diagnoses and all order for this visit:  1. Hives Steroid injection administered - tolerated well Avoid scratching if possible to decrease increase r/f infection. May apply topical antihistamine such as Benadryl or steroid cream such as Hydrocortisone 1% Report to ER or call 911 for any increase in difficulty breathing.  Stressed importance of medication compliance including Xyzal, Singulair.  - dexamethasone (DECADRON) injection 10 mg  2. Stress Avoid triggers Continue medications as directed Reviewed relaxation techniques.  Sleep hygiene. Recommended mindfulness meditation and exercise.   Limit/Decrease/Monitor drug/alcohol intake.     Notify office for further evaluation and treatment, questions or concerns if s/s fail to improve. The risks and benefits of my recommendations, as well as other treatment options were discussed with the patient today. Questions were answered.  Further disposition pending results of labs. Discussed med's effects and SE's.    Over 15 minutes of exam, counseling, chart review, and critical decision making was performed.   Future Appointments  Date Time Provider Department Center  09/28/2022  8:00 AM Wyline Beady A, NP [facility] None  06/09/2023 10:00 AM Alexandra Horton, Archie Patten, NP [facility] None    ------------------------------------------------------------------------------------------------------------------   HPI BP 118/84   Pulse (!) 103   Temp 97.9 F (36.6 C)   Ht 5\' 1"  (1.549 m)   Wt 171 lb 12.8 oz (77.9 kg)   SpO2 99%   BMI 32.46 kg/m   21 y.o.female presents for evaluation of generalized hives that started yesterday morning.  [name], torso, [name].  They are itchy.  She is trying not to scratch.  She has not tried any OTC treatment.  She does have Hydroxyzine, Xyzal, Montelukast but again has not taken as directed.  Reports being  under an increased amount of stress.  She is continuing to take medications as directed.  She is treated with Bupropion, Viibryd and Concerta  She has Nexplanon implanted for tmt of endometriosis.  She is sexually active with female partners only.   Past Medical History:  Diagnosis Date   ADHD (attention deficit hyperactivity disorder)    Dr.   Anemia      Allergies  Allergen Reactions   Lactose Intolerance (Gi)     Current Outpatient Medications on File Prior to Visit  Medication Sig   buPROPion (WELLBUTRIN XL) 150 MG 24 hr tablet Take 1 tablet by mouth daily for mood , focus & concentration   hydrOXYzine (ATARAX) 50 MG tablet Take 1 tablet (50 mg total) by mouth 3 (three) times daily.   levocetirizine (XYZAL) 5 MG tablet Take 1 tablet by mouth every evening.   methylphenidate (CONCERTA) 36 MG PO CR tablet Take 2 tablets (72 mg total) by mouth daily.   montelukast (SINGULAIR) 10 MG tablet Take 1 tablet by mouth at bedtime.   Olopatadine HCl 0.2 % SOLN Apply 1 drop to eye(s) daily as needed (itchy/watery eyes).   promethazine (PHENERGAN) 25 MG tablet Take 1 tablet (25 mg total) by mouth every 6 (six) hours as needed for nausea or vomiting (can cause fatigue). Max: 4 tablets per day   SUMAtriptan (IMITREX) 50 MG tablet Take 1 tablet (50 mg total) by mouth once as needed for migraine. May repeat in 2 hours if headache persists or recurs.   Vilazodone HCl (VIIBRYD) 40 MG TABS Take 1 tablet by mouth daily.   doxycycline (VIBRAMYCIN) 100 MG capsule Take 1 capsule by  mouth twice daily with food (Patient not taking: Reported on 07/26/2022)   hydrOXYzine (ATARAX) 50 MG tablet Take 1 tablet by mouth 3 times a day. (Patient not taking: Reported on 07/26/2022)   nitrofurantoin, macrocrystal-monohydrate, (MACROBID) 100 MG capsule Take 1 capsule (100 mg total) by mouth 2 (two) times daily. (Patient not taking: Reported on 07/26/2022)   omeprazole (PRILOSEC) 20 MG capsule Take 1 capsule by mouth  daily to prevent heart burn & indigestion (Patient not taking: Reported on 06/08/2022)   spironolactone (ALDACTONE) 50 MG tablet Take 2 tablets by mouth daily (Patient not taking: Reported on 07/02/2022)   tretinoin (RETIN-A) 0.05 % cream Apply pea sized amount to entire face nightly. (Patient not taking: Reported on 07/02/2022)   [DISCONTINUED] escitalopram (LEXAPRO) 20 MG tablet Take      1 tablet      Daily       for Mood   Current Facility-Administered Medications on File Prior to Visit  Medication   etonogestrel (NEXPLANON) implant 68 mg    ROS: all negative except what is noted in the HPI.   Physical Exam:  BP 118/84   Pulse (!) 103   Temp 97.9 F (36.6 C)   Ht 5\' 1"  (1.549 m)   Wt 171 lb 12.8 oz (77.9 kg)   SpO2 99%   BMI 32.46 kg/m   General Appearance: NAD.  Awake, conversant and cooperative. Eyes: PERRLA, EOMs intact.  Sclera white.  Conjunctiva without erythema. Sinuses: No frontal/maxillary tenderness.  No nasal discharge. Nares patent.  ENT/Mouth: Ext aud canals clear.  Bilateral TMs w/DOL and without erythema or bulging. Hearing intact.  Posterior pharynx without swelling or exudate.  Tonsils without swelling or erythema.  Neck: Supple.  No masses, nodules or thyromegaly. Respiratory: Effort is regular with non-labored breathing. Breath sounds are equal bilaterally without rales, rhonchi, wheezing or stridor.  Cardio: RRR with no MRGs. Brisk peripheral pulses without edema.  Abdomen: Active BS in all four quadrants.  Soft and non-tender without guarding, rebound tenderness, hernias or masses. Lymphatics: Non tender without lymphadenopathy.  Musculoskeletal: Full ROM, 5/5 strength, normal ambulation.  No clubbing or cyanosis. Skin: Scattered areas of irregular shaped raised erythematous areas along LUE, torso, lower back, BLE.  Appropriate color for ethnicity. Warm. Neuro: CN II-XII grossly normal. Normal muscle tone without cerebellar symptoms and intact sensation.    Psych: AO X 3,  appropriate mood and affect, insight and judgment.     Darrol Jump, NP 12:05 PM [facility]

## 2022-07-26 NOTE — Patient Instructions (Signed)
Hives Hives are itchy, red, swollen areas on your skin. Hives can show up on any part of your body. Hives often fade within 24 hours (acute hives). New hives can show up after old ones fade. This can go on for many days or weeks (chronic hives). Hives do not spread from person to person (are not contagious). Hives are caused by your body's response to something that you are allergic to (allergen). These are sometimes called triggers. You can get hives right after being around a trigger, or hours later. What are the causes? Allergies to foods. Insect bites or stings. Exposure to pollen or pets. Spending time in sunlight, heat, or cold. Exercise. Stress. You can also get hives from other medical conditions and treatments, such as: Some medicines. Chemicals or latex. Viruses. This includes the common cold. Infections caused by germs (bacteria). Allergy shots. Blood transfusions. Sometimes, the cause is not known. What increases the risk? Being a woman. Being allergic to foods such as: Citrus fruits. Milk. Eggs. Peanuts. Tree nuts. Shellfish. Being allergic to: Medicines. Latex. Insects. Animals. Pollen. What are the signs or symptoms?  Raised, itchy, red or white bumps or patches on your skin. These areas may: Get large and swollen. Change in shape and location. Stand alone or connect to each other over a large area of skin. Sting or hurt. Turn white when pressed in the center (blanch). In very bad cases, your hands, feet, and face may also get swollen. This may happen if hives start deeper in your skin. How is this treated? Treatment for this condition depends on your symptoms. Treatment may include: Using cool, wet cloths (cool compresses) or taking cool showers to stop the itching. Medicines that help: Relieve itching (antihistamines). Reduce swelling (corticosteroids). Treat infection (antibiotics). A medicine (omalizumab) that is given as a shot (injection). Your  doctor may prescribe this if you have hives that do not get better even after other treatments. In very bad cases, you may need a shot of a medicine called epinephrine to prevent a life-threatening allergic reaction (anaphylaxis). Follow these instructions at home: Medicines Take or apply over-the-counter and prescription medicines only as told by your doctor. If you were prescribed an antibiotic medicine, use it as told by your doctor. Do not stop using it even if you start to feel better. Skin care Apply cool, wet cloths to the hives. Do not scratch your skin. Do not rub your skin. General instructions Do not take hot showers or baths. This can make itching worse. Do not wear tight clothes. Use sunscreen and wear clothes that cover your skin when you are outside. Avoid any triggers that cause your hives. Keep a journal to help track what causes your hives. Write down: What medicines you take. What you eat and drink. What products you use on your skin. Keep all follow-up visits as told by your doctor. This is important. Contact a doctor if: Your symptoms are not better with medicine. Your joints hurt or are swollen. Get help right away if: You have a fever. You have pain in your belly (abdomen). Your tongue or lips are swollen. Your eyelids are swollen. Your chest or throat feels tight. You have trouble breathing or swallowing. These symptoms may be an emergency. Do not wait to see if the symptoms will go away. Get medical help right away. Call your local emergency services (911 in the [place] Do not drive yourself to the hospital. Summary Hives are itchy, red, swollen areas on your skin. Treatment   for this condition depends on your symptoms. Avoid things that cause your hives. Keep a journal to help track what causes your hives. Take and apply over-the-counter and prescription medicines only as told by your doctor. Get help right away if your chest or throat feels tight or if you  have trouble breathing or swallowing. This information is not intended to replace advice given to you by your health care provider. Make sure you discuss any questions you have with your health care provider. Document Revised: 10/10/2020 Document Reviewed: 10/12/2020 Elsevier Patient Education  2023 [employer]vier Inc.  

## 2022-07-27 ENCOUNTER — Ambulatory Visit: Payer: No Typology Code available for payment source | Admitting: Family Medicine

## 2022-07-27 NOTE — Progress Notes (Deleted)
   [address] [place] [address] Dept: [phone]  FOLLOW UP NOTE  Patient ID: Alexandra Horton, female    DOB: 27-Jun-2001  Age: 21 y.o. MRN: [id number] Date of Office Visit: 07/27/2022  Assessment  Chief Complaint: No chief complaint on file.  HPI Alexandra Horton is a 21 year old female who presents to the clinic for evaluation of new onset urticaria.  She was last seen in this clinic on 07/15/2021 by Dr. Selena Batten for evaluation of allergic rhinitis and allergic conjunctivitis.  Her last environmental allergy testing was on 07/15/2021 and was positive to pollen ragweed pollen, tree pollen, and mold.  In the interim, she visited the emergency department yesterday for evaluation of hives where she received  Drug Allergies:  Allergies  Allergen Reactions   Lactose Intolerance (Gi)     Physical Exam: There were no vitals taken for this visit.   Physical Exam  Diagnostics:    Assessment and Plan: No diagnosis found.  No orders of the defined types were placed in this encounter.   There are no Patient Instructions on file for this visit.  No follow-ups on file.    Thank you for the opportunity to care for this patient.  Please do not hesitate to contact me with questions.  Thermon Leyland, FNP [facility]

## 2022-08-18 ENCOUNTER — Other Ambulatory Visit: Payer: Self-pay | Admitting: Internal Medicine

## 2022-08-18 ENCOUNTER — Other Ambulatory Visit (HOSPITAL_COMMUNITY): Payer: Self-pay

## 2022-08-18 ENCOUNTER — Other Ambulatory Visit: Payer: Self-pay | Admitting: Nurse Practitioner

## 2022-08-18 DIAGNOSIS — R112 Nausea with vomiting, unspecified: Secondary | ICD-10-CM

## 2022-08-18 MED ORDER — PROMETHAZINE HCL 25 MG PO TABS
25.0000 mg | ORAL_TABLET | Freq: Four times a day (QID) | ORAL | 0 refills | Status: DC | PRN
  Filled 2022-08-18: qty 90, 23d supply, fill #0

## 2022-08-19 ENCOUNTER — Other Ambulatory Visit (HOSPITAL_COMMUNITY): Payer: Self-pay

## 2022-08-19 ENCOUNTER — Other Ambulatory Visit: Payer: Self-pay

## 2022-08-24 ENCOUNTER — Other Ambulatory Visit (HOSPITAL_COMMUNITY): Payer: Self-pay

## 2022-08-24 ENCOUNTER — Other Ambulatory Visit: Payer: Self-pay

## 2022-08-24 MED ORDER — METHYLPHENIDATE HCL ER (OSM) 36 MG PO TBCR
72.0000 mg | EXTENDED_RELEASE_TABLET | Freq: Every day | ORAL | 0 refills | Status: DC
  Filled 2022-08-24 – 2022-09-02 (×2): qty 60, 30d supply, fill #0

## 2022-08-25 ENCOUNTER — Other Ambulatory Visit: Payer: Self-pay

## 2022-08-25 ENCOUNTER — Other Ambulatory Visit (HOSPITAL_COMMUNITY): Payer: Self-pay

## 2022-08-26 ENCOUNTER — Other Ambulatory Visit (HOSPITAL_COMMUNITY): Payer: Self-pay

## 2022-08-31 ENCOUNTER — Encounter: Payer: Self-pay | Admitting: Nurse Practitioner

## 2022-09-01 ENCOUNTER — Other Ambulatory Visit (HOSPITAL_COMMUNITY): Payer: Self-pay

## 2022-09-01 NOTE — Telephone Encounter (Signed)
Irregular bleeding is very common with Nexplanon especially the first 6 months. May try Ibuprofen 800mg  TID to help stop the bleeding. If it persists past 6 months schedule appt with her provider for further evaluation.

## 2022-09-01 NOTE — Telephone Encounter (Signed)
The ibuprofen works to thin the lining of the uterus, thus decreasing the irregular bleeding.

## 2022-09-02 ENCOUNTER — Other Ambulatory Visit (HOSPITAL_COMMUNITY): Payer: Self-pay

## 2022-09-10 ENCOUNTER — Encounter: Payer: Self-pay | Admitting: Nurse Practitioner

## 2022-09-10 NOTE — Telephone Encounter (Signed)
Needs OV or we can offer low dose COCs x 3 months to help with bleeding.

## 2022-09-13 ENCOUNTER — Other Ambulatory Visit: Payer: Self-pay | Admitting: Nurse Practitioner

## 2022-09-13 ENCOUNTER — Other Ambulatory Visit (HOSPITAL_COMMUNITY): Payer: Self-pay

## 2022-09-13 DIAGNOSIS — N921 Excessive and frequent menstruation with irregular cycle: Secondary | ICD-10-CM

## 2022-09-13 MED ORDER — NORETHIN ACE-ETH ESTRAD-FE 1-20 MG-MCG PO TABS
1.0000 | ORAL_TABLET | Freq: Every day | ORAL | 0 refills | Status: DC
  Filled 2022-09-13: qty 84, 84d supply, fill #0

## 2022-09-20 ENCOUNTER — Encounter: Payer: Self-pay | Admitting: Nurse Practitioner

## 2022-09-20 DIAGNOSIS — L509 Urticaria, unspecified: Secondary | ICD-10-CM

## 2022-09-28 ENCOUNTER — Other Ambulatory Visit (HOSPITAL_COMMUNITY)
Admission: RE | Admit: 2022-09-28 | Discharge: 2022-09-28 | Disposition: A | Discharge status: Home / Self Care | Payer: 59 | Source: Ambulatory Visit | Attending: Nurse Practitioner | Admitting: Nurse Practitioner

## 2022-09-28 ENCOUNTER — Encounter: Payer: Self-pay | Admitting: Nurse Practitioner

## 2022-09-28 ENCOUNTER — Ambulatory Visit (INDEPENDENT_AMBULATORY_CARE_PROVIDER_SITE_OTHER): Payer: 59 | Admitting: Nurse Practitioner

## 2022-09-28 ENCOUNTER — Other Ambulatory Visit (HOSPITAL_COMMUNITY): Payer: Self-pay

## 2022-09-28 VITALS — BP 122/76 | Ht 60.75 in | Wt 180.0 lb

## 2022-09-28 DIAGNOSIS — Z01419 Encounter for gynecological examination (general) (routine) without abnormal findings: Secondary | ICD-10-CM | POA: Diagnosis not present

## 2022-09-28 DIAGNOSIS — Z3046 Encounter for surveillance of implantable subdermal contraceptive: Secondary | ICD-10-CM

## 2022-09-28 DIAGNOSIS — N760 Acute vaginitis: Secondary | ICD-10-CM | POA: Diagnosis not present

## 2022-09-28 DIAGNOSIS — B9689 Other specified bacterial agents as the cause of diseases classified elsewhere: Secondary | ICD-10-CM | POA: Diagnosis not present

## 2022-09-28 DIAGNOSIS — N898 Other specified noninflammatory disorders of vagina: Secondary | ICD-10-CM | POA: Diagnosis not present

## 2022-09-28 LAB — WET PREP FOR TRICH, YEAST, CLUE

## 2022-09-28 MED ORDER — METRONIDAZOLE 500 MG PO TABS
500.0000 mg | ORAL_TABLET | Freq: Two times a day (BID) | ORAL | 0 refills | Status: DC
  Filled 2022-09-28: qty 14, 7d supply, fill #0

## 2022-09-28 NOTE — Progress Notes (Signed)
   Tephanie Escorcia [birth year] [id number]   History:  22 y.o. G0 presents for annual exam. Nexplanon inserted 04/2022. Had episode of prolonged bleeding recently after no menses for 2 months. Took one dose of COCs and bleeding stopped. Has not received Gardasil and is not interested at this time.   Gynecologic History No LMP recorded. Patient has had an implant.   Contraception/Family planning: none Sexually active: Yes, same sex partner. Declines STD screening  Health Maintenance Last Pap: Never Last mammogram: Not indicated Last colonoscopy: Not indicated  Last Dexa: Not indicated  Past medical history, past surgical history, family history and social history were all reviewed and documented in the EPIC chart. Looking for employment. Considering cosmetology school.   ROS:  A ROS was performed and pertinent positives and negatives are included.  Exam:  Vitals:   09/28/22 0803  BP: 122/76  Weight: 180 lb (81.6 kg)  Height: 5' 0.75" (1.543 m)     Body mass index is 34.29 kg/m.  General appearance:  Normal Thyroid:  Symmetrical, normal in size, without palpable masses or nodularity. Respiratory  Auscultation:  Clear without wheezing or rhonchi Cardiovascular  Auscultation:  Regular rate, without rubs, murmurs or gallops  Edema/varicosities:  Not grossly evident Abdominal  Soft,nontender, without masses, guarding or rebound.  Liver/spleen:  No organomegaly noted  Hernia:  None appreciated  Skin  Inspection:  Grossly normal   Breasts: Not indicated per guidelines Genitourinary   Inguinal/mons:  Normal without inguinal adenopathy  External genitalia:  Normal appearing vulva with no masses, tenderness, or lesions  BUS/Urethra/Skene's glands:  Normal  Vagina:  Yellow/green discharge present, erythema  Cervix:  Normal appearing without discharge or lesions  Uterus:  Normal in size, shape and contour.  Midline and mobile, nontender  Adnexa/parametria:     Rt: Normal in size,  without masses or tenderness.   Lt: Normal in size, without masses or tenderness.  Anus and perineum: Normal  Patient informed chaperone available to be present for breast and pelvic exam. Patient has requested no chaperone to be present. Patient has been advised what will be completed during breast and pelvic exam.   Wet prep + clue cells (+ odor)  Assessment/Plan:  22 y.o. G0 for annual exam.   Well female exam with routine gynecological exam - Plan: Cytology - PAP( [facility]). Education provided on SBEs, importance of preventative screenings, current guidelines, high calcium diet, regular exercise, safe sex, and multivitamin daily. Labs done elsewhere.   Encounter for surveillance of implantable subdermal contraceptive - Nexplanon inserted 04/2022. Had episode of prolonged bleeding after no menses for 2 months. Took one dose of COCs and bleeding stopped.   Vaginal discharge - Plan: [place], CLUE. Wet prep positive for clue cells.   Bacterial vaginosis - Plan: metroNIDAZOLE (FLAGYL) 500 MG tablet BID x 7 days.   Screening for cervical cancer - Initial pap today.  Follow up in 1 year for annual.     [place], 8:12 AM 09/28/2022

## 2022-10-01 ENCOUNTER — Other Ambulatory Visit (HOSPITAL_COMMUNITY): Payer: Self-pay

## 2022-10-01 DIAGNOSIS — L538 Other specified erythematous conditions: Secondary | ICD-10-CM | POA: Diagnosis not present

## 2022-10-01 DIAGNOSIS — L728 Other follicular cysts of the skin and subcutaneous tissue: Secondary | ICD-10-CM | POA: Diagnosis not present

## 2022-10-01 DIAGNOSIS — L298 Other pruritus: Secondary | ICD-10-CM | POA: Diagnosis not present

## 2022-10-01 DIAGNOSIS — Z789 Other specified health status: Secondary | ICD-10-CM | POA: Diagnosis not present

## 2022-10-01 MED ORDER — DOXYCYCLINE HYCLATE 100 MG PO CAPS
100.0000 mg | ORAL_CAPSULE | Freq: Two times a day (BID) | ORAL | 0 refills | Status: DC
  Filled 2022-10-01: qty 28, 14d supply, fill #0

## 2022-10-06 LAB — CYTOLOGY - PAP

## 2022-10-12 ENCOUNTER — Other Ambulatory Visit (HOSPITAL_COMMUNITY): Payer: Self-pay

## 2022-10-13 ENCOUNTER — Other Ambulatory Visit: Payer: Self-pay

## 2022-10-15 ENCOUNTER — Other Ambulatory Visit: Payer: Self-pay

## 2022-11-05 DIAGNOSIS — F411 Generalized anxiety disorder: Secondary | ICD-10-CM | POA: Diagnosis not present

## 2022-11-05 DIAGNOSIS — F410 Panic disorder [episodic paroxysmal anxiety] without agoraphobia: Secondary | ICD-10-CM | POA: Diagnosis not present

## 2022-12-06 ENCOUNTER — Ambulatory Visit
Admission: EM | Admit: 2022-12-06 | Discharge: 2022-12-06 | Disposition: A | Discharge status: Home / Self Care | Payer: 59

## 2022-12-06 ENCOUNTER — Ambulatory Visit: Payer: 59 | Admitting: Nurse Practitioner

## 2022-12-06 DIAGNOSIS — L729 Follicular cyst of the skin and subcutaneous tissue, unspecified: Secondary | ICD-10-CM | POA: Diagnosis not present

## 2022-12-06 NOTE — Discharge Instructions (Signed)
Follow-up with dermatologist for potential cyst removal. Can continue with warm compress and take Tylenol and/or ibuprofen to help with discomfort.

## 2022-12-06 NOTE — ED Triage Notes (Signed)
Patient c/o cyst begin left ear lobe that appeared around last Tuesdays/ Wednesday.   Home interventions: Heat pack

## 2022-12-06 NOTE — Progress Notes (Unsigned)
Assessment and Plan:  Alexandra Horton was seen today for an episodic visit.  Diagnoses and all order for this visit:  Boil Continue to use warm compress or heating pad to allow for possible opening and drainage. Triple abx ointment provided x3 to place behind ear TID. Start tmt with Bactrim  RTC if s/s fail to improve.  - sulfamethoxazole-trimethoprim (BACTRIM DS) 800-160 MG tablet; Take 1 tablet by mouth 2 (two) times daily.  Dispense: 6 tablet; Refill: 0  Stress Not currently on any medications for tmt of Stress/Anxiety Reviewed relaxation techniques.  Sleep hygiene. Recommended mindfulness meditation and exercise.   Insight-oriented psychotherapy given for 16 minutes exclusively. Encouraged personality growth wand development through coping techniques and problem-solving skills. Limit/Decrease/Monitor drug/alcohol intake.    Notify office for further evaluation and treatment, questions or concerns if s/s fail to improve. The risks and benefits of my recommendations, as well as other treatment options were discussed with the patient today. Questions were answered.  Further disposition pending results of labs. Discussed med's effects and SE's.    Over 15 minutes of exam, counseling, chart review, and critical decision making was performed.   Future Appointments  Date Time Provider [place]  06/09/2023 10:00 AM Shalaya Swailes, Kenney Houseman, NP [facility] None    ------------------------------------------------------------------------------------------------------------------   HPI BP 108/82   Pulse 98   Temp 97.9 F (36.6 C)   Ht 5\' 1"  (1.549 m)   Wt 187 lb 3.2 oz (84.9 kg)   SpO2 99%   BMI 35.37 kg/m   Patient presents to clinic for evaluation of bump on the back of the left ear lobe.  States that she has been applying warm compresses to area with no relief. Associated pain and pressure.  She has a hx of having a similar bump in the past. States she gets them whenever she is  under stress.  She has currently stopped her Bupropion, Escitalopram, Hydroxyzine, Methylphenidate, Vilazodone. Denies fever, chills, redness, warmth, drainage.   Past Medical History:  Diagnosis Date   ADHD (attention deficit hyperactivity disorder)    Dr. Loura Back   Anemia      Allergies  Allergen Reactions   Lactose Intolerance (Gi)     Current Outpatient Medications on File Prior to Visit  Medication Sig   buPROPion (WELLBUTRIN XL) 150 MG 24 hr tablet Take 1 tablet by mouth daily for mood , focus & concentration (Patient not taking: Reported on 12/07/2022)   doxycycline (VIBRAMYCIN) 100 MG capsule Take 1 capsule (100 mg total) by mouth 2 (two) times daily with food, avoid laying flat for 1-2 hours after (Patient not taking: Reported on 12/07/2022)   hydrOXYzine (ATARAX) 50 MG tablet Take 1 tablet (50 mg total) by mouth 3 (three) times daily. (Patient not taking: Reported on 12/07/2022)   levocetirizine (XYZAL) 5 MG tablet Take 1 tablet by mouth every evening. (Patient not taking: Reported on 12/07/2022)   methylphenidate (CONCERTA) 36 MG PO CR tablet Take 2 tablets (72 mg total) by mouth daily. (Patient not taking: Reported on 12/07/2022)   metroNIDAZOLE (FLAGYL) 500 MG tablet Take 1 tablet (500 mg total) by mouth 2 (two) times daily. (Patient not taking: Reported on 12/07/2022)   montelukast (SINGULAIR) 10 MG tablet Take 1 tablet by mouth at bedtime. (Patient not taking: Reported on 12/07/2022)   Olopatadine HCl 0.2 % SOLN Apply 1 drop to eye(s) daily as needed (itchy/watery eyes). (Patient not taking: Reported on 12/07/2022)   promethazine (PHENERGAN) 25 MG tablet Take 1 tablet (25 mg total)  by mouth every 6 (six) hours as needed for nausea or vomiting (can cause fatigue). Max: 4 tablets per day (Patient not taking: Reported on 12/07/2022)   SUMAtriptan (IMITREX) 50 MG tablet Take 1 tablet (50 mg total) by mouth once as needed for migraine. May repeat in 2 hours if headache persists or recurs. (Patient not  taking: Reported on 12/07/2022)   Vilazodone HCl (VIIBRYD) 40 MG TABS Take 1 tablet by mouth daily. (Patient not taking: Reported on 12/07/2022)   [DISCONTINUED] escitalopram (LEXAPRO) 20 MG tablet Take      1 tablet      Daily       for Mood   [DISCONTINUED] spironolactone (ALDACTONE) 50 MG tablet Take 2 tablets by mouth daily (Patient not taking: Reported on 07/02/2022)   Current Facility-Administered Medications on File Prior to Visit  Medication   etonogestrel (NEXPLANON) implant 68 mg    ROS: all negative except what is noted in the HPI.   Physical Exam:  BP 108/82   Pulse 98   Temp 97.9 F (36.6 C)   Ht 5\' 1"  (1.549 m)   Wt 187 lb 3.2 oz (84.9 kg)   SpO2 99%   BMI 35.37 kg/m     General Appearance: NAD.  Awake, conversant and cooperative. Eyes: PERRLA, EOMs intact.  Sclera white.  Conjunctiva without erythema. Sinuses: No frontal/maxillary tenderness.  No nasal discharge. Nares patent.  ENT/Mouth: Ext aud canals clear.  Bilateral TMs w/DOL and without erythema or bulging. Hearing intact.  Posterior pharynx without swelling or exudate.  Tonsils without swelling or erythema.  Neck: Supple.  No masses, nodules or thyromegaly. Respiratory: Effort is regular with non-labored breathing. Breath sounds are equal bilaterally without rales, rhonchi, wheezing or stridor.  Cardio: RRR with no MRGs. Brisk peripheral pulses without edema.  Abdomen: Active BS in all four quadrants.  Soft and non-tender without guarding, rebound tenderness, hernias or masses. Lymphatics: Non tender without lymphadenopathy.  Musculoskeletal: Full ROM, 5/5 strength, normal ambulation.  No clubbing or cyanosis. Skin: Left ear lobe weith small round raised nodule. Very small pin head size pustule.  Unable to drain.  No erythema. Appropriate color for ethnicity. Warm. Neuro: CN II-XII grossly normal. Normal muscle tone without cerebellar symptoms and intact sensation.   Psych: AO X 3,  appropriate mood and affect,  insight and judgment.     Darrol Jump, NP 10:28 AM [facility]

## 2022-12-06 NOTE — ED Provider Notes (Signed)
[facility]    CSN: [id number] Arrival date & time: 12/06/22  1308      History   Chief Complaint Chief Complaint  Patient presents with   Cyst    HPI Alexandra Horton is a 22 y.o. female.   Patient presents with concerns of possible cyst to the back of her left ear. She states she noticed the bump about a week ago and it seems to be getting bigger. She reports it is tender and has pressure due to the swelling but not really painful. She denies drainage from the area or any known injury. She has tried applying a warm compress without improvement.      Past Medical History:  Diagnosis Date   ADHD (attention deficit hyperactivity disorder)    Dr. Loura Back   Anemia     Patient Active Problem List   Diagnosis Date Noted   Diarrhea 08/25/2021   Other allergic rhinitis 07/15/2021   Allergic conjunctivitis of both eyes 07/15/2021   Migraine without aura and without status migrainosus, not intractable 11/26/2020   Elevated LFTs 03/18/2020   Obesity (BMI 30.0-34.9) 03/17/2020   Depression with anxiety 03/08/2019   Acne 11/15/2017   H/O tics 01/03/2014   Auditory processing disorder 05/04/2013   ADD (attention deficit disorder) 07/05/2011    Past Surgical History:  Procedure Laterality Date   WISDOM TOOTH EXTRACTION  2019    OB History     Gravida  0   Para  0   Term  0   Preterm  0   AB  0   Living  0      SAB  0   IAB  0   Ectopic  0   Multiple  0   Live Births  0            Home Medications    Prior to Admission medications   Medication Sig Start Date End Date Taking? Authorizing Provider  buPROPion (WELLBUTRIN XL) 150 MG 24 hr tablet Take 1 tablet by mouth daily for mood , focus & concentration 11/27/21   Unk Pinto, MD  doxycycline (VIBRAMYCIN) 100 MG capsule Take 1 capsule (100 mg total) by mouth 2 (two) times daily with food, avoid laying flat for 1-2 hours after 10/01/22     hydrOXYzine (ATARAX) 50 MG tablet Take 1 tablet (50  mg total) by mouth 3 (three) times daily. 02/16/22     levocetirizine (XYZAL) 5 MG tablet Take 1 tablet by mouth every evening. 07/15/21   Garnet Sierras, DO  methylphenidate (CONCERTA) 36 MG PO CR tablet Take 2 tablets (72 mg total) by mouth daily. 08/24/22   Darrol Jump, NP  metroNIDAZOLE (FLAGYL) 500 MG tablet Take 1 tablet (500 mg total) by mouth 2 (two) times daily. 09/28/22   Marny Lowenstein A, NP  montelukast (SINGULAIR) 10 MG tablet Take 1 tablet by mouth at bedtime. 07/15/21   Garnet Sierras, DO  Olopatadine HCl 0.2 % SOLN Apply 1 drop to eye(s) daily as needed (itchy/watery eyes). 07/15/21   Garnet Sierras, DO  promethazine (PHENERGAN) 25 MG tablet Take 1 tablet (25 mg total) by mouth every 6 (six) hours as needed for nausea or vomiting (can cause fatigue). Max: 4 tablets per day 08/18/22   Unk Pinto, MD  SUMAtriptan (IMITREX) 50 MG tablet Take 1 tablet (50 mg total) by mouth once as needed for migraine. May repeat in 2 hours if headache persists or recurs. 06/08/22 06/09/23  Darrol Jump, NP  Vilazodone HCl (VIIBRYD) 40 MG TABS Take 1 tablet by mouth daily. 06/08/22   Darrol Jump, NP  escitalopram (LEXAPRO) 20 MG tablet Take      1 tablet      Daily       for Mood 08/13/20 10/01/20  Unk Pinto, MD  spironolactone (ALDACTONE) 50 MG tablet Take 2 tablets by mouth daily Patient not taking: Reported on 07/02/2022 09/17/21 08/19/22      Family History Family History  Problem Relation Age of Onset   Allergic rhinitis Mother    Allergic rhinitis Father    Hypertension Father    Migraines Father    ADD / ADHD Father    ADD / ADHD Brother    Diabetes type I Brother    Migraines Maternal Aunt    Hypothyroidism Paternal Aunt    Asthma Maternal Grandmother    Migraines Maternal Grandmother    Autism Cousin        Maternal 1st Cousin   ADD / ADHD Cousin        Several Paternal 1st Cousins have ADHD    Social History Social History   Tobacco Use   Smoking status: Never     Passive exposure: Never   Smokeless tobacco: Never  Vaping Use   Vaping Use: Some days  Substance Use Topics   Alcohol use: Yes    Comment: occasionally   Drug use: Never     Allergies   Lactose intolerance (gi)   Review of Systems Review of Systems  Constitutional:  Negative for fever.  HENT:  Negative for ear discharge.   Skin:  Negative for color change.  Neurological:  Negative for headaches.     Physical Exam Triage Vital Signs ED Triage Vitals  Enc Vitals Group     BP 12/06/22 1314 113/70     Pulse Rate 12/06/22 1314 86     Resp 12/06/22 1314 18     Temp 12/06/22 1314 98 F (36.7 C)     Temp Source 12/06/22 1314 Oral     SpO2 12/06/22 1314 98 %     Weight --      Height --      Head Circumference --      Peak Flow --      Pain Score 12/06/22 1313 4     Pain Loc --      Pain Edu? --      Excl. in [place]? --    No data found.  Updated Vital Signs BP 113/70 (BP Location: Right Arm)   Pulse 86   Temp 98 F (36.7 C) (Oral)   Resp 18   SpO2 98%   Visual Acuity Right Eye Distance:   Left Eye Distance:   Bilateral Distance:    Right Eye Near:   Left Eye Near:    Bilateral Near:     Physical Exam Vitals and nursing note reviewed.  Constitutional:      General: She is not in acute distress. HENT:     Ears:     Comments: Approx 81mm diameter cyst to posterior left ear lobe. Mobile. No overlying erythema. No drainage or purulence. Mild tenderness.  Cardiovascular:     Pulses: Normal pulses.  Pulmonary:     Effort: Pulmonary effort is normal.  Lymphadenopathy:     Cervical: No cervical adenopathy.  Neurological:     Mental Status: She is alert.      UC Treatments / Results  Labs (all labs ordered are  listed, but only abnormal results are displayed) Labs Reviewed - No data to display  EKG   Radiology No results found.  Procedures Procedures (including critical care time)  Medications Ordered in UC Medications - No data to  display  Initial Impression / Assessment and Plan / UC Course  I have reviewed the triage vital signs and the nursing notes.  Pertinent labs & imaging results that were available during my care of the patient were reviewed by me and considered in my medical decision making (see chart for details).     Cyst to ear lobe, no evidence of abscess or active infection. Discussed with patient recommendation to follow-up with dermatology for potential removal given location and to ensure full removal so there is no recurrence.   E/M: 1 acute uncomplicated illness, no data, low risk  Final Clinical Impressions(s) / UC Diagnoses   Final diagnoses:  Subcutaneous cyst     Discharge Instructions      Follow-up with dermatologist for potential cyst removal. Can continue with warm compress and take Tylenol and/or ibuprofen to help with discomfort.     ED Prescriptions   None    PDMP not reviewed this encounter.   Delsa Sale, [place] 12/06/22 [phone]

## 2022-12-07 ENCOUNTER — Other Ambulatory Visit (HOSPITAL_COMMUNITY): Payer: Self-pay

## 2022-12-07 ENCOUNTER — Encounter: Payer: Self-pay | Admitting: Nurse Practitioner

## 2022-12-07 ENCOUNTER — Ambulatory Visit (INDEPENDENT_AMBULATORY_CARE_PROVIDER_SITE_OTHER): Payer: 59 | Admitting: Nurse Practitioner

## 2022-12-07 VITALS — BP 108/82 | HR 98 | Temp 97.9°F | Ht 61.0 in | Wt 187.2 lb

## 2022-12-07 DIAGNOSIS — L0292 Furuncle, unspecified: Secondary | ICD-10-CM | POA: Diagnosis not present

## 2022-12-07 DIAGNOSIS — F439 Reaction to severe stress, unspecified: Secondary | ICD-10-CM | POA: Diagnosis not present

## 2022-12-07 MED ORDER — SULFAMETHOXAZOLE-TRIMETHOPRIM 800-160 MG PO TABS
1.0000 | ORAL_TABLET | Freq: Two times a day (BID) | ORAL | 0 refills | Status: DC
  Filled 2022-12-07: qty 6, 3d supply, fill #0

## 2022-12-07 NOTE — Patient Instructions (Signed)
Skin Abscess  A skin abscess is an infected spot of skin. It can have pus in it. An abscess can happen in any part of your body. Some abscesses break open (rupture) on their own. Most keep getting worse unless they are treated. If your abscess is not treated, the infection can spread deeper into your body and blood. This can make you feel sick. What are the causes? Germs that enter your skin. This may happen if you have: A cut or scrape. A wound from a needle or an insect bite. Blocked oil or sweat glands. A problem with the spot where your hair goes into your skin. A fluid-filled sac called a cyst under your skin. What increases the risk? Having problems with how your blood moves through your body. Having a weak body defense system (immune system). Having diabetes. Having dry and irritated skin. Needing to get shots often. Putting drugs into your body with a needle. Having a splinter or something else in your skin. Smoking. What are the signs or symptoms? A firm bump under your skin that hurts. A bump with pus at the top. Redness and swelling. Warm or tender spots. A sore on the skin. How is this treated? You may need to: Put a heat pack or a warm, wet washcloth on the spot. Have the pus drained. Take antibiotics. Follow these instructions at home: Medicines Take over-the-counter and prescription medicines only as told by your doctor. If you were prescribed antibiotics, take them as told by your doctor. Do not stop taking them even if you start to feel better. Abscess care  If you have an abscess that has not drained, put heat on it. Use the heat source that your doctor recommends, such as a moist heat pack or a heating pad. Place a towel between your skin and the heat source. Leave the heat on for 20-30 minutes. If your skin turns bright red, take off the heat right away to prevent burns. The risk of burns is higher if you cannot feel pain, heat, or cold. Follow  instructions from your doctor about how to take care of your abscess. Make sure you: Cover the abscess with a bandage. Wash your hands with soap and water for at least 20 seconds before and after you change your bandage. If you cannot use soap and water, use hand sanitizer. Change your bandage as told by your doctor. Check your abscess every day for signs that the infection is getting worse. Check for: More redness, swelling, or pain. More fluid or blood. Warmth. More pus or a worse smell. General instructions To keep the infection from spreading: Do not share personal items or towels. Do not go in a hot tub with others. Avoid making skin contact with others. Be careful when you get rid of used bandages or any pus from the abscess. Do not smoke or use any products that contain nicotine or tobacco. If you need help quitting, ask your doctor. Contact a doctor if: You see red streaks on your skin near the abscess. You have any signs of worse infection. You vomit every time you eat or drink. You have a fever, chills, or muscle aches. The cyst or abscess comes back. Get help right away if: You have very bad pain. You make less pee (urine) than normal. This information is not intended to replace advice given to you by your health care provider. Make sure you discuss any questions you have with your health care provider. Document Revised: 04/07/2022   Document Reviewed: 04/07/2022 Elsevier Patient Education  2023 Elsevier Inc.  

## 2022-12-22 ENCOUNTER — Encounter: Payer: Self-pay | Admitting: Nurse Practitioner

## 2022-12-23 NOTE — Telephone Encounter (Signed)
Call to patient, Left message to call [facility] Triage at [phone], option 4.  AEX 09/28/22

## 2022-12-27 DIAGNOSIS — F411 Generalized anxiety disorder: Secondary | ICD-10-CM | POA: Diagnosis not present

## 2022-12-28 NOTE — Telephone Encounter (Signed)
Last AEX 09/28/22.   Routing to Tiffany to review and advise. Unable to reach patient by telephone to further assess.

## 2023-01-03 ENCOUNTER — Other Ambulatory Visit (HOSPITAL_COMMUNITY): Payer: Self-pay

## 2023-01-03 ENCOUNTER — Other Ambulatory Visit: Payer: Self-pay | Admitting: Nurse Practitioner

## 2023-01-03 MED ORDER — CLOBETASOL PROPIONATE 0.05 % EX OINT
1.0000 | TOPICAL_OINTMENT | Freq: Two times a day (BID) | CUTANEOUS | 0 refills | Status: DC
  Filled 2023-01-03: qty 30, 15d supply, fill #0

## 2023-01-03 NOTE — Addendum Note (Signed)
Addended byWyline Beady on: 01/03/2023 02:22 PM   Modules accepted: Orders

## 2023-01-06 ENCOUNTER — Encounter: Payer: Self-pay | Admitting: Nurse Practitioner

## 2023-01-06 ENCOUNTER — Ambulatory Visit (INDEPENDENT_AMBULATORY_CARE_PROVIDER_SITE_OTHER): Payer: 59 | Admitting: Nurse Practitioner

## 2023-01-06 ENCOUNTER — Other Ambulatory Visit (HOSPITAL_COMMUNITY): Payer: Self-pay

## 2023-01-06 VITALS — BP 110/78 | HR 108 | Temp 97.9°F | Ht 61.0 in | Wt 188.2 lb

## 2023-01-06 DIAGNOSIS — R600 Localized edema: Secondary | ICD-10-CM

## 2023-01-06 DIAGNOSIS — L0202 Furuncle of face: Secondary | ICD-10-CM | POA: Diagnosis not present

## 2023-01-06 DIAGNOSIS — L539 Erythematous condition, unspecified: Secondary | ICD-10-CM | POA: Diagnosis not present

## 2023-01-06 MED ORDER — SULFAMETHOXAZOLE-TRIMETHOPRIM 800-160 MG PO TABS
1.0000 | ORAL_TABLET | Freq: Two times a day (BID) | ORAL | 0 refills | Status: DC
  Filled 2023-01-06: qty 6, 3d supply, fill #0

## 2023-01-06 NOTE — Patient Instructions (Signed)
Skin Abscess  A skin abscess is an infected spot of skin. It can have pus in it. An abscess can happen in any part of your body. Some abscesses break open (rupture) on their own. Most keep getting worse unless they are treated. If your abscess is not treated, the infection can spread deeper into your body and blood. This can make you feel sick. What are the causes? Germs that enter your skin. This may happen if you have: A cut or scrape. A wound from a needle or an insect bite. Blocked oil or sweat glands. A problem with the spot where your hair goes into your skin. A fluid-filled sac called a cyst under your skin. What increases the risk? Having problems with how your blood moves through your body. Having a weak body defense system (immune system). Having diabetes. Having dry and irritated skin. Needing to get shots often. Putting drugs into your body with a needle. Having a splinter or something else in your skin. Smoking. What are the signs or symptoms? A firm bump under your skin that hurts. A bump with pus at the top. Redness and swelling. Warm or tender spots. A sore on the skin. How is this treated? You may need to: Put a heat pack or a warm, wet washcloth on the spot. Have the pus drained. Take antibiotics. Follow these instructions at home: Medicines Take over-the-counter and prescription medicines only as told by your doctor. If you were prescribed antibiotics, take them as told by your doctor. Do not stop taking them even if you start to feel better. Abscess care  If you have an abscess that has not drained, put heat on it. Use the heat source that your doctor recommends, such as a moist heat pack or a heating pad. Place a towel between your skin and the heat source. Leave the heat on for 20-30 minutes. If your skin turns bright red, take off the heat right away to prevent burns. The risk of burns is higher if you cannot feel pain, heat, or cold. Follow  instructions from your doctor about how to take care of your abscess. Make sure you: Cover the abscess with a bandage. Wash your hands with soap and water for at least 20 seconds before and after you change your bandage. If you cannot use soap and water, use hand sanitizer. Change your bandage as told by your doctor. Check your abscess every day for signs that the infection is getting worse. Check for: More redness, swelling, or pain. More fluid or blood. Warmth. More pus or a worse smell. General instructions To keep the infection from spreading: Do not share personal items or towels. Do not go in a hot tub with others. Avoid making skin contact with others. Be careful when you get rid of used bandages or any pus from the abscess. Do not smoke or use any products that contain nicotine or tobacco. If you need help quitting, ask your doctor. Contact a doctor if: You see red streaks on your skin near the abscess. You have any signs of worse infection. You vomit every time you eat or drink. You have a fever, chills, or muscle aches. The cyst or abscess comes back. Get help right away if: You have very bad pain. You make less pee (urine) than normal. This information is not intended to replace advice given to you by your health care provider. Make sure you discuss any questions you have with your health care provider. Document Revised: 04/07/2022   Document Reviewed: 04/07/2022 Elsevier Patient Education  2023 Elsevier Inc.  

## 2023-01-06 NOTE — Progress Notes (Signed)
Assessment and Plan:  Samanatha Brammer was seen today for an episodic visit.  Diagnoses and all order for this visit:  Boil, face/Erythema/Localized edema Pimple patch removed to reveal boil pustule with slight drainage. Mild manual manipulation applied to area that produced moderate amount of pustule drainage. Cleansed and covered with gauze. Start tmt with antibiotic. Apply  warm compress several times throughout the day to continue drainage.  Apply OTC triple antibiotic ointment daily. May take 800 mg IBU TID for pain and swelling. RTC if s/s fail to improve.  Continue to follow with Dermatology   - sulfamethoxazole-trimethoprim (BACTRIM DS) 800-160 MG tablet; Take 1 tablet by mouth 2 (two) times daily.  Dispense: 6 tablet; Refill: 0  Continue to monitor for any increase in fever, chills, N/V.  Notify office for further evaluation and treatment, questions or concerns if s/s fail to improve. The risks and benefits of my recommendations, as well as other treatment options were discussed with the patient today. Questions were answered.  Further disposition pending results of labs. Discussed med's effects and SE's.    Over 20 minutes of exam, counseling, chart review, and critical decision making was performed.   Future Appointments  Date Time Provider Department Center  06/09/2023 10:00 AM Keren Alverio, Archie Patten, NP [facility] None    ------------------------------------------------------------------------------------------------------------------   HPI BP 110/78   Pulse (!) 108   Temp 97.9 F (36.6 C)   Ht 5\' 1"  (1.549 m)   Wt 188 lb 3.2 oz (85.4 kg)   SpO2 99%   BMI 35.56 kg/m   22 y.o.female presents for evaluation of skin boil on left cheek.  She follows with Dermatology and reports taking Spironolactone and Doxycyline in the past but stopped d/t ineffectiveness.  She reports that area started to swell overnight and has caused her increase pain in left cheek and gum area.  She  currently has a pimple patch in place.  She has not applied warm compress.  She has been taking IBU 800 mg x1 with some relief.  Denies fever, chills, N/V.    Past Medical History:  Diagnosis Date   ADHD (attention deficit hyperactivity disorder)    Dr. Verdie Mosher   Anemia      Allergies  Allergen Reactions   Lactose Intolerance (Gi)     Current Outpatient Medications on File Prior to Visit  Medication Sig   buPROPion (WELLBUTRIN XL) 150 MG 24 hr tablet Take 1 tablet by mouth daily for mood , focus & concentration (Patient not taking: Reported on 12/07/2022)   hydrOXYzine (ATARAX) 50 MG tablet Take 1 tablet (50 mg total) by mouth 3 (three) times daily. (Patient not taking: Reported on 12/07/2022)   levocetirizine (XYZAL) 5 MG tablet Take 1 tablet by mouth every evening. (Patient not taking: Reported on 12/07/2022)   methylphenidate (CONCERTA) 36 MG PO CR tablet Take 2 tablets (72 mg total) by mouth daily. (Patient not taking: Reported on 12/07/2022)   montelukast (SINGULAIR) 10 MG tablet Take 1 tablet by mouth at bedtime. (Patient not taking: Reported on 12/07/2022)   Olopatadine HCl 0.2 % SOLN Apply 1 drop to eye(s) daily as needed (itchy/watery eyes). (Patient not taking: Reported on 12/07/2022)   promethazine (PHENERGAN) 25 MG tablet Take 1 tablet (25 mg total) by mouth every 6 (six) hours as needed for nausea or vomiting (can cause fatigue). Max: 4 tablets per day (Patient not taking: Reported on 12/07/2022)   sulfamethoxazole-trimethoprim (BACTRIM DS) 800-160 MG tablet Take 1 tablet by mouth 2 (two) times daily. (  Patient not taking: Reported on 01/06/2023)   SUMAtriptan (IMITREX) 50 MG tablet Take 1 tablet (50 mg total) by mouth once as needed for migraine. May repeat in 2 hours if headache persists or recurs. (Patient not taking: Reported on 12/07/2022)   Vilazodone HCl (VIIBRYD) 40 MG TABS Take 1 tablet by mouth daily. (Patient not taking: Reported on 12/07/2022)   [DISCONTINUED] escitalopram (LEXAPRO) 20 MG  tablet Take      1 tablet      Daily       for Mood   [DISCONTINUED] spironolactone (ALDACTONE) 50 MG tablet Take 2 tablets by mouth daily (Patient not taking: Reported on 07/02/2022)   Current Facility-Administered Medications on File Prior to Visit  Medication   etonogestrel (NEXPLANON) implant 68 mg    ROS: all negative except what is noted in the HPI.   Physical Exam:  BP 110/78   Pulse (!) 108   Temp 97.9 F (36.6 C)   Ht 5\' 1"  (1.549 m)   Wt 188 lb 3.2 oz (85.4 kg)   SpO2 99%   BMI 35.56 kg/m   General Appearance: NAD.  Awake, conversant and cooperative. Eyes: PERRLA, EOMs intact.  Sclera white.  Conjunctiva without erythema. Sinuses: No frontal/maxillary tenderness.  No nasal discharge. Nares patent.  ENT/Mouth: Ext aud canals clear.  Bilateral TMs w/DOL and without erythema or bulging. Hearing intact.  Posterior pharynx without swelling or exudate.  Tonsils without swelling or erythema.  Neck: Supple.  No masses, nodules or thyromegaly. Respiratory: Effort is regular with non-labored breathing. Breath sounds are equal bilaterally without rales, rhonchi, wheezing or stridor.  Cardio: RRR with no MRGs. Brisk peripheral pulses without edema.  Abdomen: Active BS in all four quadrants.  Soft and non-tender without guarding, rebound tenderness, hernias or masses. Lymphatics: Non tender without lymphadenopathy.  Musculoskeletal: Full ROM, 5/5 strength, normal ambulation.  No clubbing or cyanosis. Skin: Left cheek with approximately 2 cm round raised firm nodule with pus type drainage.  Mildly edematous.  Tender to palpation. Appropriate color for ethnicity. Surrounding skin warm without rashes, lesions, ecchymosis, ulcers.  Neuro: CN II-XII grossly normal. Normal muscle tone without cerebellar symptoms and intact sensation.   Psych: AO X 3,  appropriate mood and affect, insight and judgment.     Adela Glimpse, NP 4:43 PM [facility]

## 2023-01-10 ENCOUNTER — Other Ambulatory Visit: Payer: Self-pay

## 2023-01-10 ENCOUNTER — Other Ambulatory Visit: Payer: Self-pay | Admitting: Nurse Practitioner

## 2023-01-10 ENCOUNTER — Other Ambulatory Visit (HOSPITAL_COMMUNITY): Payer: Self-pay

## 2023-01-10 DIAGNOSIS — Z975 Presence of (intrauterine) contraceptive device: Secondary | ICD-10-CM

## 2023-01-10 MED ORDER — MEGESTROL ACETATE 40 MG PO TABS
40.0000 mg | ORAL_TABLET | Freq: Every day | ORAL | 0 refills | Status: DC
  Filled 2023-01-10: qty 10, 10d supply, fill #0

## 2023-01-24 ENCOUNTER — Encounter: Payer: Self-pay | Admitting: Nurse Practitioner

## 2023-03-21 DIAGNOSIS — F411 Generalized anxiety disorder: Secondary | ICD-10-CM | POA: Diagnosis not present

## 2023-03-28 DIAGNOSIS — F411 Generalized anxiety disorder: Secondary | ICD-10-CM | POA: Diagnosis not present

## 2023-03-30 ENCOUNTER — Encounter: Payer: Self-pay | Admitting: Nurse Practitioner

## 2023-04-04 DIAGNOSIS — F411 Generalized anxiety disorder: Secondary | ICD-10-CM | POA: Diagnosis not present

## 2023-04-04 DIAGNOSIS — F332 Major depressive disorder, recurrent severe without psychotic features: Secondary | ICD-10-CM | POA: Diagnosis not present

## 2023-04-11 ENCOUNTER — Other Ambulatory Visit (HOSPITAL_BASED_OUTPATIENT_CLINIC_OR_DEPARTMENT_OTHER): Payer: Self-pay

## 2023-04-11 DIAGNOSIS — F411 Generalized anxiety disorder: Secondary | ICD-10-CM | POA: Diagnosis not present

## 2023-04-11 DIAGNOSIS — F332 Major depressive disorder, recurrent severe without psychotic features: Secondary | ICD-10-CM | POA: Diagnosis not present

## 2023-04-11 MED ORDER — LAMOTRIGINE 25 MG PO TABS
ORAL_TABLET | ORAL | 0 refills | Status: DC
  Filled 2023-04-11: qty 54, 30d supply, fill #0

## 2023-04-11 MED ORDER — PROPRANOLOL HCL 10 MG PO TABS
10.0000 mg | ORAL_TABLET | Freq: Three times a day (TID) | ORAL | 0 refills | Status: DC | PRN
  Filled 2023-04-11: qty 90, 30d supply, fill #0

## 2023-04-13 DIAGNOSIS — F411 Generalized anxiety disorder: Secondary | ICD-10-CM | POA: Diagnosis not present

## 2023-04-13 DIAGNOSIS — F332 Major depressive disorder, recurrent severe without psychotic features: Secondary | ICD-10-CM | POA: Diagnosis not present

## 2023-04-18 ENCOUNTER — Encounter: Payer: Self-pay | Admitting: Nurse Practitioner

## 2023-04-25 DIAGNOSIS — F411 Generalized anxiety disorder: Secondary | ICD-10-CM | POA: Diagnosis not present

## 2023-04-25 DIAGNOSIS — F332 Major depressive disorder, recurrent severe without psychotic features: Secondary | ICD-10-CM | POA: Diagnosis not present

## 2023-04-27 ENCOUNTER — Other Ambulatory Visit: Payer: Self-pay | Admitting: Internal Medicine

## 2023-04-27 DIAGNOSIS — F988 Other specified behavioral and emotional disorders with onset usually occurring in childhood and adolescence: Secondary | ICD-10-CM

## 2023-04-27 DIAGNOSIS — R112 Nausea with vomiting, unspecified: Secondary | ICD-10-CM

## 2023-04-28 ENCOUNTER — Other Ambulatory Visit: Payer: Self-pay

## 2023-04-29 ENCOUNTER — Other Ambulatory Visit: Payer: Self-pay

## 2023-04-29 ENCOUNTER — Other Ambulatory Visit (HOSPITAL_COMMUNITY): Payer: Self-pay

## 2023-04-29 MED ORDER — BUPROPION HCL ER (XL) 150 MG PO TB24
150.0000 mg | ORAL_TABLET | Freq: Every day | ORAL | 3 refills | Status: DC
  Filled 2023-04-29 – 2023-05-11 (×2): qty 90, 90d supply, fill #0
  Filled 2023-07-20 – 2023-08-16 (×2): qty 90, 90d supply, fill #1
  Filled 2023-10-14 – 2023-11-17 (×2): qty 90, 90d supply, fill #2
  Filled 2024-02-08 – 2024-03-02 (×4): qty 90, 90d supply, fill #3

## 2023-04-29 MED ORDER — PROMETHAZINE HCL 25 MG PO TABS
25.0000 mg | ORAL_TABLET | ORAL | 0 refills | Status: DC | PRN
  Filled 2023-04-29 – 2023-05-11 (×2): qty 90, 15d supply, fill #0

## 2023-05-02 ENCOUNTER — Encounter: Payer: 59 | Admitting: Nurse Practitioner

## 2023-05-06 ENCOUNTER — Other Ambulatory Visit (HOSPITAL_COMMUNITY): Payer: Self-pay

## 2023-05-07 ENCOUNTER — Other Ambulatory Visit (HOSPITAL_BASED_OUTPATIENT_CLINIC_OR_DEPARTMENT_OTHER): Payer: Self-pay

## 2023-05-09 DIAGNOSIS — F332 Major depressive disorder, recurrent severe without psychotic features: Secondary | ICD-10-CM | POA: Diagnosis not present

## 2023-05-09 DIAGNOSIS — F411 Generalized anxiety disorder: Secondary | ICD-10-CM | POA: Diagnosis not present

## 2023-05-10 ENCOUNTER — Other Ambulatory Visit (HOSPITAL_BASED_OUTPATIENT_CLINIC_OR_DEPARTMENT_OTHER): Payer: Self-pay

## 2023-05-10 DIAGNOSIS — F332 Major depressive disorder, recurrent severe without psychotic features: Secondary | ICD-10-CM | POA: Diagnosis not present

## 2023-05-10 DIAGNOSIS — F411 Generalized anxiety disorder: Secondary | ICD-10-CM | POA: Diagnosis not present

## 2023-05-10 MED ORDER — LAMOTRIGINE 100 MG PO TABS
100.0000 mg | ORAL_TABLET | Freq: Every day | ORAL | 0 refills | Status: DC
  Filled 2023-05-10: qty 30, 30d supply, fill #0

## 2023-05-11 ENCOUNTER — Other Ambulatory Visit (HOSPITAL_BASED_OUTPATIENT_CLINIC_OR_DEPARTMENT_OTHER): Payer: Self-pay

## 2023-05-11 ENCOUNTER — Other Ambulatory Visit (HOSPITAL_COMMUNITY): Payer: Self-pay

## 2023-05-11 DIAGNOSIS — L728 Other follicular cysts of the skin and subcutaneous tissue: Secondary | ICD-10-CM | POA: Diagnosis not present

## 2023-05-19 ENCOUNTER — Encounter: Payer: 59 | Admitting: Nurse Practitioner

## 2023-05-24 DIAGNOSIS — F332 Major depressive disorder, recurrent severe without psychotic features: Secondary | ICD-10-CM | POA: Diagnosis not present

## 2023-05-24 DIAGNOSIS — F411 Generalized anxiety disorder: Secondary | ICD-10-CM | POA: Diagnosis not present

## 2023-05-25 DIAGNOSIS — F411 Generalized anxiety disorder: Secondary | ICD-10-CM | POA: Diagnosis not present

## 2023-05-25 DIAGNOSIS — F332 Major depressive disorder, recurrent severe without psychotic features: Secondary | ICD-10-CM | POA: Diagnosis not present

## 2023-05-26 ENCOUNTER — Other Ambulatory Visit (HOSPITAL_BASED_OUTPATIENT_CLINIC_OR_DEPARTMENT_OTHER): Payer: Self-pay

## 2023-06-01 ENCOUNTER — Encounter: Payer: Self-pay | Admitting: Nurse Practitioner

## 2023-06-02 NOTE — Telephone Encounter (Signed)
Call placed to patient to further assess.  Left message to call [facility] Triage at [phone], option 4.

## 2023-06-07 ENCOUNTER — Other Ambulatory Visit (HOSPITAL_BASED_OUTPATIENT_CLINIC_OR_DEPARTMENT_OTHER): Payer: Self-pay

## 2023-06-07 DIAGNOSIS — F411 Generalized anxiety disorder: Secondary | ICD-10-CM | POA: Diagnosis not present

## 2023-06-07 DIAGNOSIS — F332 Major depressive disorder, recurrent severe without psychotic features: Secondary | ICD-10-CM | POA: Diagnosis not present

## 2023-06-07 MED ORDER — LAMOTRIGINE 150 MG PO TABS
150.0000 mg | ORAL_TABLET | Freq: Every day | ORAL | 0 refills | Status: DC
  Filled 2023-06-07: qty 30, 30d supply, fill #0

## 2023-06-07 MED ORDER — PROPRANOLOL HCL 10 MG PO TABS
10.0000 mg | ORAL_TABLET | Freq: Three times a day (TID) | ORAL | 0 refills | Status: DC | PRN
  Filled 2023-06-07: qty 90, 30d supply, fill #0

## 2023-06-08 ENCOUNTER — Encounter: Payer: 59 | Admitting: Nurse Practitioner

## 2023-06-09 ENCOUNTER — Encounter: Payer: Self-pay | Admitting: Nurse Practitioner

## 2023-06-09 ENCOUNTER — Ambulatory Visit (INDEPENDENT_AMBULATORY_CARE_PROVIDER_SITE_OTHER): Payer: 59 | Admitting: Nurse Practitioner

## 2023-06-09 VITALS — BP 108/78 | HR 94 | Temp 97.8°F | Ht 61.0 in | Wt 186.8 lb

## 2023-06-09 DIAGNOSIS — Z79899 Other long term (current) drug therapy: Secondary | ICD-10-CM | POA: Diagnosis not present

## 2023-06-09 DIAGNOSIS — Z0000 Encounter for general adult medical examination without abnormal findings: Secondary | ICD-10-CM | POA: Diagnosis not present

## 2023-06-09 DIAGNOSIS — Z131 Encounter for screening for diabetes mellitus: Secondary | ICD-10-CM | POA: Diagnosis not present

## 2023-06-09 DIAGNOSIS — Z0001 Encounter for general adult medical examination with abnormal findings: Secondary | ICD-10-CM | POA: Diagnosis not present

## 2023-06-09 DIAGNOSIS — E66811 Obesity, class 1: Secondary | ICD-10-CM

## 2023-06-09 DIAGNOSIS — Z13220 Encounter for screening for lipoid disorders: Secondary | ICD-10-CM | POA: Diagnosis not present

## 2023-06-09 DIAGNOSIS — Z1389 Encounter for screening for other disorder: Secondary | ICD-10-CM

## 2023-06-09 DIAGNOSIS — Z1329 Encounter for screening for other suspected endocrine disorder: Secondary | ICD-10-CM

## 2023-06-09 DIAGNOSIS — F418 Other specified anxiety disorders: Secondary | ICD-10-CM | POA: Diagnosis not present

## 2023-06-09 DIAGNOSIS — G43009 Migraine without aura, not intractable, without status migrainosus: Secondary | ICD-10-CM

## 2023-06-09 DIAGNOSIS — E559 Vitamin D deficiency, unspecified: Secondary | ICD-10-CM

## 2023-06-09 DIAGNOSIS — F411 Generalized anxiety disorder: Secondary | ICD-10-CM | POA: Diagnosis not present

## 2023-06-09 DIAGNOSIS — F439 Reaction to severe stress, unspecified: Secondary | ICD-10-CM

## 2023-06-09 DIAGNOSIS — Z9109 Other allergy status, other than to drugs and biological substances: Secondary | ICD-10-CM

## 2023-06-09 DIAGNOSIS — L700 Acne vulgaris: Secondary | ICD-10-CM

## 2023-06-09 DIAGNOSIS — F332 Major depressive disorder, recurrent severe without psychotic features: Secondary | ICD-10-CM | POA: Diagnosis not present

## 2023-06-09 DIAGNOSIS — F902 Attention-deficit hyperactivity disorder, combined type: Secondary | ICD-10-CM

## 2023-06-09 NOTE — Progress Notes (Signed)
Complete Physical  Assessment and Plan:  Alexandra Horton was seen today for annual exam.  Diagnoses and all orders for this visit:  Encounter for routine adult health examination without abnormal findings Due Annually Refused Pap - sexually active with women.  Plans to get at GYN Refused STD screening.   Attention deficit disorder (ADD) without hyperactivity Medications providing over 30% improvement of attention deficit hyperactivity disorder. No change in management. Using lowest effective dose. Discussed alternative pharmacological and non-pharmacological therapies. No suspected aberrant drug-taking behaviors. Continue to monitor  Depression with anxiety Continue Wellbutrin and Viibryd (refilled). Continue Tele Therapy with [facility], Ronna Polio   Obesity (BMI 30-39.9) Discussed appropriate BMI Diet modification. Physical activity. Encouraged/praised to build confidence.   Screening for thyroid disorder -     TSH  Screening for hematuria or proteinuria -     Urinalysis, Routine w reflex microscopic -     Microalbumin / creatinine urine ratio  Vitamin D deficiency -     VITAMIN D 25 Hydroxy (Vit-D Deficiency, Fractures)  Screening, lipid      -     Lipid  Medication management All medications discussed and reviewed in full. All questions and concerns regarding medications addressed.     Screening for diabetes mellitus -     Hemoglobin A1c  Acne vulgaris Dermatology following  Continue current treatment regimen until evaluated by derm  Environmental allergies Continue to follow with [facility] Continue Zyrtec daily, avoid triggers  Migraine Stay well hydrated Abortive tmt as directed. Avoid triggers  Orders Placed This Encounter  Procedures   CBC with Differential/Platelet   COMPLETE METABOLIC PANEL WITH GFR   Lipid panel   TSH   Hemoglobin A1c   Insulin, random   Cortisol   Notify office for further evaluation and treatment, questions  or concerns if any reported s/s fail to improve.   The patient was advised to call back or seek an in-person evaluation if any symptoms worsen or if the condition fails to improve as anticipated.   Further disposition pending results of labs. Discussed med's effects and SE's.    I discussed the assessment and treatment plan with the patient. The patient was provided an opportunity to ask questions and all were answered. The patient agreed with the plan and demonstrated an understanding of the instructions.  Discussed med's effects and SE's. Screening labs and tests as requested with regular follow-up as recommended.  I provided 30 minutes of face-to-face time during this encounter including counseling, chart review, and critical decision making was preformed.   HPI  This very nice 22 y.o.female presents for complete physical.   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  She is currently looking for a new job.  She continues to be sexually active with a female partner.   She is no longer seeing a Therapist, sports but is following with a psychologist through [facility].  She has not had her Viibryd for 1 month and no longer wishes to continue due to increase in  irritability and anger.  She is continuing the Wellbutrin.   She has been taking Zyrtec PRN for allergies.  Was referred to specialist and attends PRN.  Currently well controlled.  She has acne vulgaris and was referred to dermatologist that started tmt with Doxycycline and Tretinoin but she feels as though this is not working.  She continues to have cystic acne, most notably around cheek area, now has acne on the arms  and AC joint area.  She is requesting to be referred to [facility] to which she reports having treatment with in the past as a child.   She is deferring pap today along with STD testing.  She is monogamous with a female partner.  Has not had intercourse.  She does follow [facility].  Had IUD removed an Nexplanon placed.  She feels this has been contributing to her mood swings and increase in migraine HA.  Reports dx of endometriosis.  She is requesting to have further work up/pap through [facility].  She was once on Maxalt but this is no longer working to control her HA symptoms.  She is trying to stay well hydrated.  She has missed worked several times last month d/t migraine HA uncontrolled by Maxalt, associated N/V, photophobia.    She does not workout. BMI is Body mass index is 35.3 kg/m., she has been working on diet and exercise. Wt Readings from Last 3 Encounters:  06/09/23 186 lb 12.8 oz (84.7 kg)  01/06/23 188 lb 3.2 oz (85.4 kg)  12/07/22 187 lb 3.2 oz (84.9 kg)    Finally, patient has history of Vitamin D Deficiency not is not taking supplementation and last vitamin D was  Lab Results  Component Value Date   VD25OH 21 (L) 06/08/2022    Last CBC was [facility] Lab Results  Component Value Date   WBC 9.7 06/20/2022   HGB 12.9 06/20/2022   HCT 38.5 06/20/2022   MCV 80.9 06/20/2022   PLT 303 06/20/2022      Current Medications:  Current Outpatient Medications on File Prior to Visit  Medication Sig Dispense Refill   buPROPion (WELLBUTRIN XL) 150 MG 24 hr tablet Take 1 tablet (150 mg total) by mouth daily for mood, focus and concentration. 90 tablet 3   lamoTRIgine (LAMICTAL) 150 MG tablet Take 1 tablet (150 mg total) by mouth at bedtime. 30 tablet 0   promethazine (PHENERGAN) 25 MG tablet Take 1 tablet (25 mg total) by mouth every 4 (four) hours as needed for nausea 90 tablet 0   propranolol (INDERAL) 10 MG tablet Take 1 tablet (10 mg total) by mouth 3 (three) times daily as needed  (monitor blood pressure while taking- hold for Systolic BP less than 100 and Diastolic BP less than 60) 90 tablet 0   SUMAtriptan (IMITREX) 50 MG tablet Take 1 tablet (50 mg total) by mouth once as needed for migraine. May repeat in 2 hours if headache persists or recurs.  30 tablet 2   hydrOXYzine (ATARAX) 50 MG tablet Take 1 tablet (50 mg total) by mouth 3 (three) times daily. 90 tablet 0   lamoTRIgine (LAMICTAL) 100 MG tablet Take 1 tablet (100 mg total) by mouth at bedtime. 30 tablet 0   lamoTRIgine (LAMICTAL) 25 MG tablet Take 1 tablet (25mg ) by mouth at bedtime for 14 days, then 2 tablets at bedtime for 14 days, then 4 tablets (100 mg) at bedtime until bottle finished (Call NP if rash) (if out of lamictal more than 1 wk do not restart, call NP) 54 tablet 0   levocetirizine (XYZAL) 5 MG tablet Take 1 tablet by mouth every evening. 30 tablet 3   megestrol (MEGACE) 40 MG tablet Take 1 tablet (40 mg total) by mouth daily. 10 tablet 0   methylphenidate (CONCERTA) 36 MG PO CR tablet Take 2 tablets (72 mg total) by mouth daily. (Patient not taking: Reported on 12/07/2022) 60 tablet 0  montelukast (SINGULAIR) 10 MG tablet Take 1 tablet by mouth at bedtime. 30 tablet 3   Olopatadine HCl 0.2 % SOLN Apply 1 drop to eye(s) daily as needed (itchy/watery eyes). 2.5 mL 3   sulfamethoxazole-trimethoprim (BACTRIM DS) 800-160 MG tablet Take 1 tablet by mouth 2 (two) times daily. 6 tablet 0   Vilazodone HCl (VIIBRYD) 40 MG TABS Take 1 tablet by mouth daily. (Patient not taking: Reported on 12/07/2022) 30 tablet 2   [DISCONTINUED] escitalopram (LEXAPRO) 20 MG tablet Take      1 tablet      Daily       for Mood 90 tablet 0   [DISCONTINUED] spironolactone (ALDACTONE) 50 MG tablet Take 2 tablets by mouth daily (Patient not taking: Reported on 07/02/2022) 60 tablet 2   Current Facility-Administered Medications on File Prior to Visit  Medication Dose Route Frequency Provider Last Rate Last Admin   etonogestrel (NEXPLANON) implant 68 mg  68 mg Subdermal Once Olivia Mackie, NP       Health Maintenance:   Immunization History  Administered Date(s) Administered   DTaP 06/14/2001, 08/16/2001, 10/11/2001, 07/11/2002, 06/22/2005   HIB (PRP-OMP) 06/14/2001, 08/16/2001, 10/11/2001,  07/11/2002   Hepatitis B July [birth year], 2001, 06/14/2001, 01/10/2002   IPV 06/14/2001, 08/16/2001, 01/10/2002, 06/22/2005   Influenza Nasal 06/15/2007, 10/10/2009, 06/11/2010, 07/05/2011, 08/22/2012   Influenza,Quad,Nasal, Live 07/19/2013   Influenza,inj,quad, With Preservative 06/02/2016   Influenza-Unspecified 07/11/2020, 06/11/2021   MMR 04/11/2002, 06/22/2005   Moderna Sars-Covid-2 Vaccination 12/04/2019, 01/18/2020   PPD Test 07/18/2020, 09/19/2020, 10/01/2020   Pneumococcal Conjugate-13 06/14/2001, 08/16/2001, 01/10/2002, 06/22/2005   Td 08/25/2021   Tdap 07/05/2011   Varicella 04/11/2002, 06/22/2005    TD/TDAP: 10/29 2012 declines today Influenza: Due  Pneumovax:N/A  Prevnar 13: N/A HPV vaccines: Declines  LMP: Mirena x 2 years, removed. No period since 11/2019 - Nexplanon 03/2022 Sexually Active: with female partner.  STD testing GC/ Chlamydia/ trichomonas done 02/17/21 all negative - declines update STD today Pap: Never- declines today MGM: N/A  Allergies:  Allergies  Allergen Reactions   Lactose Intolerance (Gi)    Medical History:  has ADD (attention deficit disorder); Auditory processing disorder; H/O tics; Acne; Depression with anxiety; Obesity (BMI 30.0-34.9); Elevated LFTs; Migraine without aura and without status migrainosus, not intractable; Other allergic rhinitis; Allergic conjunctivitis of both eyes; and Diarrhea on their problem list. Surgical History:  She  has a past surgical history that includes Wisdom tooth extraction (2019). Family History:  Her family history includes ADD / ADHD in her brother, cousin, and father; Allergic rhinitis in her father and mother; Asthma in her maternal grandmother; Autism in her cousin; Diabetes type I in her brother; Hypertension in her father; Hypothyroidism in her paternal aunt; Migraines in her father, maternal aunt, and maternal grandmother. Social History:   reports that she has never smoked. She has never been exposed to  tobacco smoke. She has never used smokeless tobacco. She reports current alcohol use. She reports that she does not use drugs.  Review of Systems: Review of Systems  Constitutional:  Negative for chills and fever.  HENT:  Negative for congestion, hearing loss, sinus pain, sore throat and tinnitus.   Eyes:  Positive for redness. Negative for blurred vision and double vision.  Respiratory:  Negative for cough, hemoptysis, sputum production, shortness of breath and wheezing.   Cardiovascular:  Negative for chest pain, palpitations and leg swelling.  Gastrointestinal:  Negative for abdominal pain, constipation, diarrhea, heartburn, nausea and vomiting.  Genitourinary:  Negative for dysuria and  urgency.  Musculoskeletal:  Negative for back pain, falls, joint pain, myalgias and neck pain.  Skin:  Negative for rash.       Cystic acne of face  Neurological:  Negative for dizziness, tingling, tremors, weakness and headaches.  Endo/Heme/Allergies:  Does not bruise/bleed easily.  Psychiatric/Behavioral:  Positive for depression. Negative for suicidal ideas. The patient is not nervous/anxious and does not have insomnia.     Physical Exam: Estimated body mass index is 35.3 kg/m as calculated from the following:   Height as of this encounter: 5\' 1"  (1.549 m).   Weight as of this encounter: 186 lb 12.8 oz (84.7 kg). BP 108/78   Pulse 94   Temp 97.8 F (36.6 C)   Ht 5\' 1"  (1.549 m)   Wt 186 lb 12.8 oz (84.7 kg)   SpO2 98%   BMI 35.30 kg/m  General Appearance: Well nourished, in no apparent distress.  Eyes: Noticed blinking tic. PERRLA, EOMs, conjunctiva no swelling or erythema, normal fundi and vessels.  Sinuses: No Frontal/maxillary tenderness  ENT/Mouth: Ext aud canals clear, normal light reflex with TMs without erythema, bulging. Good dentition. No erythema, swelling, or exudate on post pharynx. Tonsils not swollen or erythematous. Hearing normal.  Neck: Supple, thyroid normal. No bruits   Respiratory: Respiratory effort normal, BS equal bilaterally without rales, rhonchi, wheezing or stridor.  Cardio: RRR without murmurs, rubs or gallops. Brisk peripheral pulses without edema.  Chest: symmetric, with normal excursions and percussion.  Breasts: Symmetric, without lumps, nipple discharge, retractions.  Abdomen: Soft, nontender, no guarding, rebound, hernias, masses, or organomegaly.  Lymphatics: Non tender without lymphadenopathy.  Genitourinary: Defer per patient refusal Musculoskeletal: Full ROM all peripheral extremities,5/5 strength, and normal gait.  Skin: Warm, dry without rashes, noticeable cystic acne of face Neuro: Cranial nerves intact, reflexes equal bilaterally. Normal muscle tone, no cerebellar symptoms. Sensation intact.  Psych: Awake and oriented X 3, normal affect, Insight and Judgment appropriate.   EKG: defer  Sharlette Jansma 1:09 PM [facility]

## 2023-06-09 NOTE — Patient Instructions (Signed)

## 2023-06-10 LAB — COMPLETE METABOLIC PANEL WITH GFR
AG Ratio: 1.7 (calc) (ref 1.0–2.5)
ALT: 31 U/L — ABNORMAL HIGH (ref 6–29)
AST: 20 U/L (ref 10–30)
Albumin: 4.5 g/dL (ref 3.6–5.1)
Alkaline phosphatase (APISO): 80 U/L (ref 31–125)
BUN: 16 mg/dL (ref 7–25)
CO2: 24 mmol/L (ref 20–32)
Calcium: 9.8 mg/dL (ref 8.6–10.2)
Chloride: 106 mmol/L (ref 98–110)
Creat: 0.95 mg/dL (ref 0.50–0.96)
Globulin: 2.6 g/dL (ref 1.9–3.7)
Glucose, Bld: 112 mg/dL — ABNORMAL HIGH (ref 65–99)
Potassium: 4.1 mmol/L (ref 3.5–5.3)
Sodium: 139 mmol/L (ref 135–146)
Total Bilirubin: 0.3 mg/dL (ref 0.2–1.2)
Total Protein: 7.1 g/dL (ref 6.1–8.1)
eGFR: 87 mL/min/{1.73_m2} (ref >=60)

## 2023-06-10 LAB — INSULIN, RANDOM: Insulin: 72.6 u[IU]/mL — ABNORMAL HIGH

## 2023-06-10 LAB — CBC WITH DIFFERENTIAL/PLATELET
Absolute Monocytes: 826 {cells}/uL (ref 200–950)
Basophils Absolute: 41 {cells}/uL (ref 0–200)
Basophils Relative: 0.5 %
Eosinophils Absolute: 89 {cells}/uL (ref 15–500)
Eosinophils Relative: 1.1 %
HCT: 41.1 % (ref 35.0–45.0)
Hemoglobin: 12.8 g/dL (ref 11.7–15.5)
Lymphs Abs: 2957 {cells}/uL (ref 850–3900)
MCH: 24.8 pg — ABNORMAL LOW (ref 27.0–33.0)
MCHC: 31.1 g/dL — ABNORMAL LOW (ref 32.0–36.0)
MCV: 79.5 fL — ABNORMAL LOW (ref 80.0–100.0)
MPV: 11.1 fL (ref 7.5–12.5)
Monocytes Relative: 10.2 %
Neutro Abs: 4188 {cells}/uL (ref 1500–7800)
Neutrophils Relative %: 51.7 %
Platelets: 376 10*3/uL (ref 140–400)
RBC: 5.17 10*6/uL — ABNORMAL HIGH (ref 3.80–5.10)
RDW: 15.3 % — ABNORMAL HIGH (ref 11.0–15.0)
Total Lymphocyte: 36.5 %
WBC: 8.1 10*3/uL (ref 3.8–10.8)

## 2023-06-10 LAB — LIPID PANEL
Cholesterol: 198 mg/dL (ref <200)
HDL: 45 mg/dL — ABNORMAL LOW (ref >=50)
LDL Cholesterol (Calc): 130 mg/dL — ABNORMAL HIGH
Non-HDL Cholesterol (Calc): 153 mg/dL — ABNORMAL HIGH (ref <130)
Total CHOL/HDL Ratio: 4.4 (calc) (ref <5.0)
Triglycerides: 121 mg/dL (ref <150)

## 2023-06-10 LAB — HEMOGLOBIN A1C
Hgb A1c MFr Bld: 5.8 %{Hb} — ABNORMAL HIGH (ref <5.7)
Mean Plasma Glucose: 120 mg/dL
eAG (mmol/L): 6.6 mmol/L

## 2023-06-10 LAB — TSH: TSH: 1.41 m[IU]/L

## 2023-06-21 DIAGNOSIS — F411 Generalized anxiety disorder: Secondary | ICD-10-CM | POA: Diagnosis not present

## 2023-06-21 DIAGNOSIS — F332 Major depressive disorder, recurrent severe without psychotic features: Secondary | ICD-10-CM | POA: Diagnosis not present

## 2023-06-27 DIAGNOSIS — F332 Major depressive disorder, recurrent severe without psychotic features: Secondary | ICD-10-CM | POA: Diagnosis not present

## 2023-06-27 DIAGNOSIS — F411 Generalized anxiety disorder: Secondary | ICD-10-CM | POA: Diagnosis not present

## 2023-07-04 DIAGNOSIS — F332 Major depressive disorder, recurrent severe without psychotic features: Secondary | ICD-10-CM | POA: Diagnosis not present

## 2023-07-05 ENCOUNTER — Other Ambulatory Visit (HOSPITAL_BASED_OUTPATIENT_CLINIC_OR_DEPARTMENT_OTHER): Payer: Self-pay

## 2023-07-05 DIAGNOSIS — F411 Generalized anxiety disorder: Secondary | ICD-10-CM | POA: Diagnosis not present

## 2023-07-05 MED ORDER — LAMOTRIGINE 150 MG PO TABS
150.0000 mg | ORAL_TABLET | Freq: Every day | ORAL | 0 refills | Status: DC
  Filled 2023-07-05: qty 30, 30d supply, fill #0

## 2023-07-20 ENCOUNTER — Other Ambulatory Visit (HOSPITAL_BASED_OUTPATIENT_CLINIC_OR_DEPARTMENT_OTHER): Payer: Self-pay

## 2023-08-08 DIAGNOSIS — F332 Major depressive disorder, recurrent severe without psychotic features: Secondary | ICD-10-CM | POA: Diagnosis not present

## 2023-08-08 DIAGNOSIS — F411 Generalized anxiety disorder: Secondary | ICD-10-CM | POA: Diagnosis not present

## 2023-08-09 ENCOUNTER — Encounter: Payer: Self-pay | Admitting: Nurse Practitioner

## 2023-08-09 ENCOUNTER — Other Ambulatory Visit (HOSPITAL_BASED_OUTPATIENT_CLINIC_OR_DEPARTMENT_OTHER): Payer: Self-pay

## 2023-08-09 ENCOUNTER — Ambulatory Visit: Payer: 59 | Admitting: Nurse Practitioner

## 2023-08-09 ENCOUNTER — Ambulatory Visit (INDEPENDENT_AMBULATORY_CARE_PROVIDER_SITE_OTHER): Payer: 59 | Admitting: Nurse Practitioner

## 2023-08-09 VITALS — BP 110/76 | HR 89 | Temp 98.0°F | Ht 61.0 in | Wt 195.4 lb

## 2023-08-09 DIAGNOSIS — Z79899 Other long term (current) drug therapy: Secondary | ICD-10-CM | POA: Diagnosis not present

## 2023-08-09 DIAGNOSIS — E669 Obesity, unspecified: Secondary | ICD-10-CM

## 2023-08-09 DIAGNOSIS — L0202 Furuncle of face: Secondary | ICD-10-CM | POA: Diagnosis not present

## 2023-08-09 MED ORDER — SULFAMETHOXAZOLE-TRIMETHOPRIM 800-160 MG PO TABS
1.0000 | ORAL_TABLET | Freq: Two times a day (BID) | ORAL | 0 refills | Status: DC
  Filled 2023-08-09: qty 6, 3d supply, fill #0

## 2023-08-09 NOTE — Patient Instructions (Signed)
Skin Abscess  A skin abscess is an infected area on or under your skin. It contains pus and other material. An abscess may also be called a furuncle, carbuncle, or boil. It is often the result of an infection caused by bacteria. An abscess can occur in or on almost any part of your body. Sometimes, an abscess may break open (rupture) on its own. In most cases, it will keep getting worse unless it is treated. An abscess can cause pain and make you feel ill. An untreated abscess can cause infection to spread to other parts of your body or your bloodstream. The abscess may need to be drained. You may also need to take antibiotics. What are the causes? An abscess occurs when germs, like bacteria, pass through your skin and cause an infection. This may be caused by: A scrape or cut on your skin. A puncture wound through your skin, such as a needle injection or insect bite. Blocked oil or sweat glands. Blocked and infected hair follicles. A fluid-filled sac that forms beneath your skin (sebaceous cyst) and becomes infected. What increases the risk? You may be more likely to develop an abscess if: You have problems with blood circulation, or you have a weak body defense system (immune system). You have diabetes. You have dry and irritated skin. You get injections often or use IV drugs. You have a foreign body in a wound, such as a splinter. You smoke or use tobacco products. What are the signs or symptoms? Symptoms of this condition include: A painful, firm bump under the skin. A bump with pus at the top. This may break through the skin and drain. Other symptoms include: Redness and swelling around the abscess. Warmth or tenderness. Swelling of the lymph nodes (glands) near the abscess. A sore on the skin. How is this diagnosed? This condition may be diagnosed based on a physical exam and your medical history. You may also have tests done, such as: A test of a sample of pus. This may be done  to find what is causing the infection. Blood tests. Imaging tests, such as an ultrasound, CT scan, or MRI. How is this treated? A small abscess that drains on its own may not need to be treated. Treatment for larger abscesses may include: Moist heat or a heat pack applied to the area a few times a day. Incision and drainage. This is a procedure to drain the abscess. Antibiotics. For a severe abscess, you may first get antibiotics through an IV and then change to antibiotics by mouth. Follow these instructions at home: Medicines Take over-the-counter and prescription medicines only as told by your provider. If you were prescribed antibiotics, take them as told by your provider. Do not stop using the antibiotic even if you start to feel better. Abscess care  If you have an abscess that has not drained, apply heat to the affected area. Use the heat source that your provider recommends, such as a moist heat pack or a heating pad. Place a towel between your skin and the heat source. Leave the heat on for 20-30 minutes at a time. If your skin turns bright red, remove the heat right away to prevent burns. The risk of burns is higher if you cannot feel pain, heat, or cold. Follow instructions from your provider about how to take care of your abscess. Make sure you: Cover the abscess with a bandage (dressing). Wash your hands with soap and water for at least 20 seconds before  and after you change the dressing or gauze. If soap and water are not available, use hand sanitizer. Change your dressing or gauze as told by your provider. Check your abscess every day for signs of an infection that is getting worse. Check for: More redness, swelling, pain, or tenderness. More fluid or blood. Warmth. More pus or a worse smell. General instructions To avoid spreading the infection: Do not share personal care items, towels, or hot tubs with others. Avoid making skin contact with other people. Be careful  when getting rid of used dressings, wound packing, or any drainage from the abscess. Do not use any products that contain nicotine or tobacco. These products include cigarettes, chewing tobacco, and vaping devices, such as e-cigarettes. If you need help quitting, ask your provider. Do not use any creams, ointments, or liquids unless you have been told to by your provider. Contact a health care provider if: You see redness that spreads quickly or red streaks on your skin spreading away from the abscess. You have any signs of worse infection at the abscess. You vomit every time you eat or drink. You have a fever, chills, or muscle aches. The cyst or abscess returns. Get help right away if: You have severe pain. You make less pee (urine) than normal. This information is not intended to replace advice given to you by your health care provider. Make sure you discuss any questions you have with your health care provider. Document Revised: 04/07/2022 Document Reviewed: 04/07/2022 Elsevier Patient Education  2024 [employer].

## 2023-08-09 NOTE — Progress Notes (Signed)
Assessment and Plan:  Alexandra Horton was seen today for an episodic visit.  Diagnoses and all order for this visit:  Boil, face Start tmt with Bactrim - take in full as directed Continue to apply a warm compress to area several times a day. Apply topical antibiotic cream such as Bacitracin to affected area daily. Monitor for any increase in infection and contact office if noticed.  - sulfamethoxazole-trimethoprim (BACTRIM DS) 800-160 MG tablet; Take 1 tablet by mouth 2 (two) times daily.  Dispense: 6 tablet; Refill: 0  Obesity (BMI 35.0-39.9 without comorbidity) Reach out to insurance for current coverage of weight loss medications. Discussed appropriate BMI Goal of losing 1 lb per month. Diet modification. Physical activity. Encouraged/praised to build confidence.  Medication management All medications discussed and reviewed in full. All questions and concerns regarding medications addressed.    Continue to monitor for any increase in fever, chills, N/V, diarrhea, changes to bowel habits, blood in stool.  Notify office for further evaluation and treatment, questions or concerns if s/s fail to improve. The risks and benefits of my recommendations, as well as other treatment options were discussed with the patient today. Questions were answered.  Further disposition pending results of labs. Discussed med's effects and SE's.    Over 20 minutes of exam, counseling, chart review, and critical decision making was performed.   Future Appointments  Date Time Provider Department Center  06/08/2024 10:00 AM Carisha Kantor, Archie Patten, NP [facility] None    ------------------------------------------------------------------------------------------------------------------   HPI BP 110/76   Pulse 89   Temp 98 F (36.7 C)   Ht 5\' 1"  (1.549 m)   Wt 195 lb 6.4 oz (88.6 kg)   SpO2 99%   BMI 36.92 kg/m    21 y.o.female presents for evaluation of skin boil on to left ear.  She follows with  Dermatology and reports taking Spironolactone and Doxycyline in the past but stopped d/t ineffectiveness.  She had some left over Doxycycline to which she has been taking x 1 week but has seen mild improvement.  She has been using warm compress and OTC triple antibiotic ointment. She has been able to  manipulate the area to release pus and serosanguinous type drainage.  Boil has now shrunk and is no longer draining. Denies fever, chills, N/V.   BMI is Body mass index is 36.92 kg/m., she has been working on diet and exercise.  She is interested in moving forward with further weight loss  management despite implementing lifestyle modifications. Wt Readings from Last 3 Encounters:  08/09/23 195 lb 6.4 oz (88.6 kg)  06/09/23 186 lb 12.8 oz (84.7 kg)  01/06/23 188 lb 3.2 oz (85.4 kg)     Past Medical History:  Diagnosis Date   ADHD (attention deficit hyperactivity disorder)    Dr. Verdie Mosher   Anemia      Allergies  Allergen Reactions   Lactose Intolerance (Gi)     Current Outpatient Medications on File Prior to Visit  Medication Sig   buPROPion (WELLBUTRIN XL) 150 MG 24 hr tablet Take 1 tablet (150 mg total) by mouth daily for mood, focus and concentration.   lamoTRIgine (LAMICTAL) 150 MG tablet Take 1 tablet (150 mg total) by mouth at bedtime.   propranolol (INDERAL) 10 MG tablet Take 1 tablet (10 mg total) by mouth 3 (three) times daily as needed  (monitor blood pressure while taking- hold for Systolic BP less than 100 and Diastolic BP less than 60)   methylphenidate (CONCERTA) 36 MG PO CR  tablet Take 2 tablets (72 mg total) by mouth daily. (Patient not taking: Reported on 12/07/2022)   promethazine (PHENERGAN) 25 MG tablet Take 1 tablet (25 mg total) by mouth every 4 (four) hours as needed for nausea (Patient not taking: Reported on 08/09/2023)   SUMAtriptan (IMITREX) 50 MG tablet Take 1 tablet (50 mg total) by mouth once as needed for migraine. May repeat in 2 hours if headache persists or  recurs.   [DISCONTINUED] escitalopram (LEXAPRO) 20 MG tablet Take      1 tablet      Daily       for Mood   [DISCONTINUED] spironolactone (ALDACTONE) 50 MG tablet Take 2 tablets by mouth daily (Patient not taking: Reported on 07/02/2022)   Current Facility-Administered Medications on File Prior to Visit  Medication   etonogestrel (NEXPLANON) implant 68 mg    ROS: all negative except what is noted in the HPI.   Physical Exam:  BP 110/76   Pulse 89   Temp 98 F (36.7 C)   Ht 5\' 1"  (1.549 m)   Wt 195 lb 6.4 oz (88.6 kg)   SpO2 99%   BMI 36.92 kg/m   General Appearance: NAD.  Awake, conversant and cooperative. Eyes: PERRLA, EOMs intact.  Sclera white.  Conjunctiva without erythema. Sinuses: No frontal/maxillary tenderness.  No nasal discharge. Nares patent.  ENT/Mouth: Ext aud canals clear.  Bilateral TMs w/DOL and without erythema or bulging. Hearing intact.  Posterior pharynx without swelling or exudate.  Tonsils without swelling or erythema.  Neck: Supple.  No masses, nodules or thyromegaly. Respiratory: Effort is regular with non-labored breathing. Breath sounds are equal bilaterally without rales, rhonchi, wheezing or stridor.  Cardio: RRR with no MRGs. Brisk peripheral pulses without edema.  Abdomen: Active BS in all four quadrants.  Soft and non-tender without guarding, rebound tenderness, hernias or masses. Lymphatics: Non tender without lymphadenopathy.  Musculoskeletal: Full ROM, 5/5 strength, normal ambulation.  No clubbing or cyanosis. Skin: Left posterior ear lobe with soft nodule, raised, approximately 1 cm in size.  Appropriate color for ethnicity. Warm without rashes, lesions, ecchymosis, ulcers.  Neuro: CN II-XII grossly normal. Normal muscle tone without cerebellar symptoms and intact sensation.   Psych: AO X 3,  appropriate mood and affect, insight and judgment.     Adela Glimpse, NP 11:21 AM [facility]

## 2023-08-15 ENCOUNTER — Encounter: Payer: Self-pay | Admitting: Nurse Practitioner

## 2023-08-16 ENCOUNTER — Other Ambulatory Visit (HOSPITAL_BASED_OUTPATIENT_CLINIC_OR_DEPARTMENT_OTHER): Payer: Self-pay

## 2023-08-16 ENCOUNTER — Other Ambulatory Visit: Payer: Self-pay

## 2023-08-17 ENCOUNTER — Other Ambulatory Visit (HOSPITAL_BASED_OUTPATIENT_CLINIC_OR_DEPARTMENT_OTHER): Payer: Self-pay

## 2023-08-22 ENCOUNTER — Other Ambulatory Visit (HOSPITAL_BASED_OUTPATIENT_CLINIC_OR_DEPARTMENT_OTHER): Payer: Self-pay

## 2023-08-22 MED ORDER — LAMOTRIGINE 150 MG PO TABS
150.0000 mg | ORAL_TABLET | Freq: Every day | ORAL | 1 refills | Status: DC
  Filled 2023-08-22: qty 30, 30d supply, fill #0
  Filled 2023-09-22: qty 30, 30d supply, fill #1

## 2023-08-22 MED ORDER — PROPRANOLOL HCL 10 MG PO TABS
10.0000 mg | ORAL_TABLET | Freq: Three times a day (TID) | ORAL | 1 refills | Status: DC | PRN
  Filled 2023-08-22: qty 90, 30d supply, fill #0
  Filled 2023-10-14: qty 90, 30d supply, fill #1

## 2023-09-01 ENCOUNTER — Encounter: Payer: Self-pay | Admitting: Nurse Practitioner

## 2023-09-02 NOTE — Telephone Encounter (Signed)
Needs appt

## 2023-09-05 NOTE — Telephone Encounter (Signed)
 Left message to call [facility] Triage at [phone], option 4.    MyChart message to patient.

## 2023-09-07 ENCOUNTER — Encounter: Payer: Self-pay | Admitting: Nurse Practitioner

## 2023-09-08 ENCOUNTER — Ambulatory Visit: Payer: Commercial Managed Care - PPO

## 2023-09-08 ENCOUNTER — Telehealth: Payer: Self-pay

## 2023-09-08 ENCOUNTER — Inpatient Hospital Stay: Admission: RE | Admit: 2023-09-08 | Payer: Commercial Managed Care - PPO | Source: Ambulatory Visit

## 2023-09-08 ENCOUNTER — Ambulatory Visit: Payer: 59

## 2023-09-08 ENCOUNTER — Other Ambulatory Visit (HOSPITAL_BASED_OUTPATIENT_CLINIC_OR_DEPARTMENT_OTHER): Payer: Self-pay

## 2023-09-08 ENCOUNTER — Ambulatory Visit
Admission: EM | Admit: 2023-09-08 | Discharge: 2023-09-08 | Disposition: A | Discharge status: Home / Self Care | Payer: Commercial Managed Care - PPO | Attending: Family Medicine | Admitting: Family Medicine

## 2023-09-08 DIAGNOSIS — J309 Allergic rhinitis, unspecified: Secondary | ICD-10-CM

## 2023-09-08 DIAGNOSIS — R059 Cough, unspecified: Secondary | ICD-10-CM | POA: Diagnosis not present

## 2023-09-08 DIAGNOSIS — J069 Acute upper respiratory infection, unspecified: Secondary | ICD-10-CM

## 2023-09-08 MED ORDER — PREDNISONE 20 MG PO TABS
60.0000 mg | ORAL_TABLET | Freq: Every day | ORAL | 0 refills | Status: AC
  Filled 2023-09-08: qty 15, 5d supply, fill #0

## 2023-09-08 MED ORDER — AZITHROMYCIN 250 MG PO TABS
ORAL_TABLET | ORAL | 0 refills | Status: AC
  Filled 2023-09-08: qty 6, 5d supply, fill #0

## 2023-09-08 MED ORDER — AZITHROMYCIN 250 MG PO TABS: 250.0000 mg | ORAL_TABLET | Freq: Every day | ORAL | 0 refills | Status: DC

## 2023-09-08 MED ORDER — FEXOFENADINE HCL 180 MG PO TABS: 180.0000 mg | ORAL_TABLET | Freq: Every day | ORAL | 0 refills | Status: DC

## 2023-09-08 MED ORDER — PREDNISONE 20 MG PO TABS: ORAL_TABLET | ORAL | 0 refills | Status: DC

## 2023-09-08 MED ORDER — FEXOFENADINE HCL 180 MG PO TABS
180.0000 mg | ORAL_TABLET | Freq: Every day | ORAL | 0 refills | Status: DC
  Filled 2023-09-08: qty 100, 100d supply, fill #0

## 2023-09-08 NOTE — ED Triage Notes (Signed)
 Pt c/o cough and congestion x 4 days. Denies fever. Taking dayquil prn.

## 2023-09-08 NOTE — Discharge Instructions (Addendum)
 Advised patient take medications as directed with food to completion.  Advised patient to take prednisone  and Allegra  with Zithromax  daily for the next 5 days.  Encouraged to increase daily water intake to 64 ounces per day while taking these medications.  Advised if symptoms worsen and/or unresolved please follow-up with PCP or here for further evaluation.

## 2023-09-08 NOTE — ED Provider Notes (Signed)
 [facility]    CSN: [id number] Arrival date & time: 09/08/23  1207      History   Chief Complaint Chief Complaint  Patient presents with   Cough   Nasal Congestion    HPI Tanyah Debruyne is a 23 y.o. female.   HPI 23 year old female presents with.  PMH significant for obesity, anemia, and ADHD  Past Medical History:  Diagnosis Date   ADHD (attention deficit hyperactivity disorder)    Dr. Marcello   Anemia     Patient Active Problem List   Diagnosis Date Noted   Diarrhea 08/25/2021   Other allergic rhinitis 07/15/2021   Allergic conjunctivitis of both eyes 07/15/2021   Migraine without aura and without status migrainosus, not intractable 11/26/2020   Elevated LFTs 03/18/2020   Obesity (BMI 30.0-34.9) 03/17/2020   Depression with anxiety 03/08/2019   Acne 11/15/2017   H/O tics 01/03/2014   Auditory processing disorder 05/04/2013   ADD (attention deficit disorder) 07/05/2011    Past Surgical History:  Procedure Laterality Date   WISDOM TOOTH EXTRACTION  2019    OB History     Gravida  0   Para  0   Term  0   Preterm  0   AB  0   Living  0      SAB  0   IAB  0   Ectopic  0   Multiple  0   Live Births  0            Home Medications    Prior to Admission medications   Medication Sig Start Date End Date Taking? Authorizing Provider  azithromycin  (ZITHROMAX ) 250 MG tablet Take 2 tablets (500 mg total) by mouth on Day 1, THEN 1 tablet (250 mg total) daily for 4 days. 09/08/23 09/13/23  Teddy Sharper, FNP  buPROPion  (WELLBUTRIN  XL) 150 MG 24 hr tablet Take 1 tablet (150 mg total) by mouth daily for mood, focus and concentration. 04/29/23   Tonita Fallow, MD  fexofenadine  (ALLEGRA  ALLERGY) 180 MG tablet Take 1 tablet (180 mg total) by mouth daily. 09/08/23   Teddy Sharper, FNP  lamoTRIgine  (LAMICTAL ) 150 MG tablet Take 1 tablet (150 mg total) by mouth at bedtime. 08/22/23     methylphenidate  (CONCERTA ) 36 MG PO CR tablet Take 2 tablets  (72 mg total) by mouth daily. Patient not taking: Reported on 12/07/2022 08/24/22   Cranford, Tonya, NP  predniSONE  (DELTASONE ) 20 MG tablet Take 3 tablets (60 mg total) by mouth daily for 5 days. 09/08/23 09/13/23  Teddy Sharper, FNP  promethazine  (PHENERGAN ) 25 MG tablet Take 1 tablet (25 mg total) by mouth every 4 (four) hours as needed for nausea Patient not taking: Reported on 08/09/2023 04/29/23   Tonita Fallow, MD  propranolol  (INDERAL ) 10 MG tablet Take 1 tablet (10 mg total) by mouth 3 (three) times daily as needed for anxiety (monitor blood pressure while taking- hold for Systolic BP less than 100 and Diastolic BP less than 60) 08/22/23     sulfamethoxazole -trimethoprim  (BACTRIM  DS) 800-160 MG tablet Take 1 tablet by mouth 2 (two) times daily. 08/09/23   Cranford, Tonya, NP  SUMAtriptan  (IMITREX ) 50 MG tablet Take 1 tablet (50 mg total) by mouth once as needed for migraine. May repeat in 2 hours if headache persists or recurs. 06/08/22 06/15/23  Cranford, Tonya, NP  escitalopram  (LEXAPRO ) 20 MG tablet Take      1 tablet      Daily  for Mood 08/13/20 10/01/20  Tonita Fallow, MD  spironolactone  (ALDACTONE ) 50 MG tablet Take 2 tablets by mouth daily Patient not taking: Reported on 07/02/2022 09/17/21 08/19/22      Family History Family History  Problem Relation Age of Onset   Allergic rhinitis Mother    Allergic rhinitis Father    Hypertension Father    Migraines Father    ADD / ADHD Father    ADD / ADHD Brother    Diabetes type I Brother    Migraines Maternal Aunt    Hypothyroidism Paternal Aunt    Asthma Maternal Grandmother    Migraines Maternal Grandmother    Autism Cousin        Maternal 1st Cousin   ADD / ADHD Cousin        Several Paternal 1st Cousins have ADHD    Social History Social History   Tobacco Use   Smoking status: Never    Passive exposure: Never   Smokeless tobacco: Never  Vaping Use   Vaping status: Some Days  Substance Use Topics   Alcohol use: Yes     Comment: occasionally   Drug use: Never     Allergies   Lactose intolerance (gi)   Review of Systems Review of Systems   Physical Exam Triage Vital Signs ED Triage Vitals  Encounter Vitals Group     BP      Systolic BP Percentile      Diastolic BP Percentile      Pulse      Resp      Temp      Temp src      SpO2      Weight      Height      Head Circumference      Peak Flow      Pain Score      Pain Loc      Pain Education      Exclude from Growth Chart    No data found.  Updated Vital Signs BP 123/85 (BP Location: Left Arm)   Pulse (!) 114   Temp 98.5 F (36.9 C) (Oral)   Resp 17   LMP 08/31/2023 (Approximate)   SpO2 97%     Physical Exam Vitals and nursing note reviewed.  Constitutional:      Appearance: Normal appearance. She is obese. She is ill-appearing.  HENT:     Head: Atraumatic.     Right Ear: Tympanic membrane, ear canal and external ear normal.     Left Ear: Tympanic membrane, ear canal and external ear normal.     Nose: Nose normal.     Mouth/Throat:     Mouth: Mucous membranes are moist.     Pharynx: Oropharynx is clear.  Eyes:     Extraocular Movements: Extraocular movements intact.     Conjunctiva/sclera: Conjunctivae normal.     Pupils: Pupils are equal, round, and reactive to light.  Cardiovascular:     Rate and Rhythm: Normal rate and regular rhythm.     Pulses: Normal pulses.     Heart sounds: Normal heart sounds.  Pulmonary:     Effort: Pulmonary effort is normal.     Breath sounds: Normal breath sounds. No wheezing, rhonchi or rales.  Musculoskeletal:        General: Normal range of motion.     Cervical back: Normal range of motion and neck supple.  Skin:    General: Skin is warm and dry.  Neurological:  General: No focal deficit present.     Mental Status: She is alert and oriented to person, place, and time. Mental status is at baseline.  Psychiatric:        Mood and Affect: Mood normal.        Behavior:  Behavior normal.      UC Treatments / Results  Labs (all labs ordered are listed, but only abnormal results are displayed) Labs Reviewed - No data to display  EKG   Radiology No results found.  Procedures Procedures (including critical care time)  Medications Ordered in UC Medications - No data to display  Initial Impression / Assessment and Plan / UC Course  I have reviewed the triage vital signs and the nursing notes.  Pertinent labs & imaging results that were available during my care of the patient were reviewed by me and considered in my medical decision making (see chart for details).     MDM: 1.  Acute URI-Rx'd Zithromax  500 mg day 1, then 250 mg day 2-5; 2.  Cough, unspecified type-Rx'd prednisone  20 mg tablet: Take 3 tabs p.o. daily x 5 days; 3.  Allergic rhinitis, unspecified seasonality, unspecified trigger-Rx'd Allegra  180 mg tablet: Take 1 tablet daily x 5 days. Advised patient take medications as directed with food to completion.  Advised patient to take prednisone  and Allegra  with Zithromax  daily for the next 5 days.  Encouraged to increase daily water intake to 64 ounces per day while taking these medications.  Advised if symptoms worsen and/or unresolved please follow-up with PCP or here for further evaluation.  Patient discharged home, hemodynamically stable. Final Clinical Impressions(s) / UC Diagnoses   Final diagnoses:  Acute URI  Cough, unspecified type  Allergic rhinitis, unspecified seasonality, unspecified trigger     Discharge Instructions      Advised patient take medications as directed with food to completion.  Advised patient to take prednisone  and Allegra  with Zithromax  daily for the next 5 days.  Encouraged to increase daily water intake to 64 ounces per day while taking these medications.  Advised if symptoms worsen and/or unresolved please follow-up with PCP or here for further evaluation.     ED Prescriptions     Medication Sig  Dispense Auth. Provider   azithromycin  (ZITHROMAX ) 250 MG tablet Take 1 tablet (250 mg total) by mouth daily. Take first 2 tablets together, then 1 every day until finished. 6 tablet Tyquisha Sharps, FNP   predniSONE  (DELTASONE ) 20 MG tablet Take 3 tabs PO daily x 5 days. 15 tablet Azaryah Heathcock, FNP   fexofenadine  (ALLEGRA  ALLERGY) 180 MG tablet Take 1 tablet (180 mg total) by mouth daily for 15 days. 15 tablet Wymon Swaney, FNP      PDMP not reviewed this encounter.   Teddy Sharper, FNP 09/08/23 1356

## 2023-09-08 NOTE — Telephone Encounter (Signed)
 Pt wanted pharmacy change due to insurance.

## 2023-09-15 ENCOUNTER — Ambulatory Visit: Payer: 59 | Admitting: Nurse Practitioner

## 2023-09-16 ENCOUNTER — Encounter: Payer: Self-pay | Admitting: Nurse Practitioner

## 2023-09-19 DIAGNOSIS — F332 Major depressive disorder, recurrent severe without psychotic features: Secondary | ICD-10-CM | POA: Diagnosis not present

## 2023-09-19 DIAGNOSIS — F411 Generalized anxiety disorder: Secondary | ICD-10-CM | POA: Diagnosis not present

## 2023-09-21 ENCOUNTER — Ambulatory Visit: Payer: Commercial Managed Care - PPO | Admitting: Nurse Practitioner

## 2023-09-21 ENCOUNTER — Other Ambulatory Visit (HOSPITAL_BASED_OUTPATIENT_CLINIC_OR_DEPARTMENT_OTHER): Payer: Self-pay

## 2023-09-21 VITALS — BP 126/84 | HR 77 | Wt 185.0 lb

## 2023-09-21 DIAGNOSIS — N926 Irregular menstruation, unspecified: Secondary | ICD-10-CM

## 2023-09-21 DIAGNOSIS — Z3009 Encounter for other general counseling and advice on contraception: Secondary | ICD-10-CM

## 2023-09-21 DIAGNOSIS — R635 Abnormal weight gain: Secondary | ICD-10-CM | POA: Diagnosis not present

## 2023-09-21 MED ORDER — MISOPROSTOL 200 MCG PO TABS
400.0000 ug | ORAL_TABLET | Freq: Once | ORAL | 0 refills | Status: DC
  Filled 2023-09-21: qty 2, 1d supply, fill #0

## 2023-09-21 NOTE — Progress Notes (Signed)
   Acute Office Visit  Subjective:    Patient ID: Alexandra Horton, female    DOB: [birth year], 23 y.o.   MRN: [id number]   HPI 23 y.o. G0 presents today for irregular bleeding. Nexplanon  04/2022. Unhappy with irregular bleeding and weight gain since having Nexplanon . Has done short-term COCs and Megace  for bleeding control with Nexplanon . Would like to go back to Mirena  IUD. Had for 2 years. Does not want to have periods. Worried about PCOS and would like labs today. H/O irregular periods, acne and difficulty with weight loss.   Patient's last menstrual period was 09/08/2023 (approximate).    Review of Systems  Constitutional:  Positive for unexpected weight change.  Genitourinary:  Positive for menstrual problem.  Skin:        Acne vulgaris       Objective:    Physical Exam Constitutional:      Appearance: Normal appearance. She is obese.  Skin:    Findings: Acne present.   GU: Not indicated  BP 126/84   Pulse 77   Wt 185 lb (83.9 kg)   LMP 09/08/2023 (Approximate)   SpO2 100%   BMI 34.96 kg/m  Wt Readings from Last 3 Encounters:  09/21/23 185 lb (83.9 kg)  08/09/23 195 lb 6.4 oz (88.6 kg)  06/09/23 186 lb 12.8 oz (84.7 kg)       Assessment & Plan:   Problem List Items Addressed This Visit   None Visit Diagnoses       Irregular periods    -  Primary   Relevant Orders   Follicle stimulating hormone   Luteinizing hormone   Estradiol    Testos,Total,Free and SHBG (Female)   TSH     Weight gain       Relevant Orders   TSH     General counseling and advice on female contraception       Relevant Medications   misoprostol  (CYTOTEC ) 200 MCG tablet   Other Relevant Orders   Removal of implanon  rod   IUD Insertion      Plan: Discussed bleeding patterns that can occur with Nexplanon . Also reviewed other contraceptive options for bleeding control. Interested in Mirena  IUD again. Aware can still have bleeding with Mirena . Cytotec  vaginally 12 hours prior to procedure  time, Ibuprofen  30-60 minutes prior. Labs pending.     Andee Bamberger DNP, 10:21 AM 09/21/2023

## 2023-09-22 ENCOUNTER — Encounter: Payer: Self-pay | Admitting: Nurse Practitioner

## 2023-09-29 ENCOUNTER — Encounter: Payer: Self-pay | Admitting: Nurse Practitioner

## 2023-09-29 LAB — TSH: TSH: 1.4 m[IU]/L

## 2023-09-29 LAB — TESTOS,TOTAL,FREE AND SHBG (FEMALE)
Free Testosterone: 5 pg/mL (ref 0.1–6.4)
Sex Hormone Binding: 13.7 nmol/L — ABNORMAL LOW (ref 17–124)
Testosterone, Total, LC-MS-MS: 22 ng/dL (ref 2–45)

## 2023-09-29 LAB — ESTRADIOL: Estradiol: 141 pg/mL

## 2023-09-29 LAB — FOLLICLE STIMULATING HORMONE: FSH: 5.1 m[IU]/mL

## 2023-09-29 LAB — LUTEINIZING HORMONE: LH: 8.5 m[IU]/mL

## 2023-10-02 ENCOUNTER — Encounter: Payer: Self-pay | Admitting: Nurse Practitioner

## 2023-10-03 NOTE — Telephone Encounter (Signed)
Left message to call [facility] Triage at [phone], option 4.    Tiffany  -please review and advise. Patient was seen for OV 09/21/23. Last PUS 12/30/20.

## 2023-10-03 NOTE — Telephone Encounter (Signed)
If pain is severe she should go to ER to be evaluated. She did not mention any vaginal bleeding.

## 2023-10-07 ENCOUNTER — Encounter: Payer: Self-pay | Admitting: Nurse Practitioner

## 2023-10-10 ENCOUNTER — Ambulatory Visit: Payer: Commercial Managed Care - PPO | Admitting: Nurse Practitioner

## 2023-10-10 VITALS — BP 112/76 | HR 103 | Wt 188.0 lb

## 2023-10-10 DIAGNOSIS — Z3009 Encounter for other general counseling and advice on contraception: Secondary | ICD-10-CM | POA: Diagnosis not present

## 2023-10-10 DIAGNOSIS — E282 Polycystic ovarian syndrome: Secondary | ICD-10-CM | POA: Diagnosis not present

## 2023-10-10 DIAGNOSIS — Z01812 Encounter for preprocedural laboratory examination: Secondary | ICD-10-CM

## 2023-10-10 DIAGNOSIS — Z538 Procedure and treatment not carried out for other reasons: Secondary | ICD-10-CM

## 2023-10-10 DIAGNOSIS — R1031 Right lower quadrant pain: Secondary | ICD-10-CM | POA: Diagnosis not present

## 2023-10-10 LAB — PREGNANCY, URINE: Preg Test, Ur: NEGATIVE

## 2023-10-10 NOTE — Progress Notes (Signed)
   Acute Office Visit  Subjective:    Patient ID: Alexandra Horton, female    DOB: 21-Sep-2000, 23 y.o.   MRN: [id number]   HPI 23 y.o. presents today for Mirena IUD insertion and Nexplanon removal. Does not like irregular bleeding with Nexplanon. Has menses about every 3 months that can last up to 2 weeks. Bleeding ranges from light to moderate. Very nervous about IUD insertion. Administered Cytotec prior, took 800 mg Ibuprofen. Also complains of intermittent RLQ pain for months. Reports it is crampy/sharp, random, nothing makes it worse or better, not cyclic.   Patient's last menstrual period was 09/08/2023.    Review of Systems  Constitutional: Negative.        Objective:    Physical Exam Constitutional:      Appearance: Normal appearance.     BP 112/76 (BP Location: Right Arm, Patient Position: Sitting, Cuff Size: Normal)   Pulse (!) 103   Wt 188 lb (85.3 kg)   LMP 09/08/2023   SpO2 99%   BMI 35.52 kg/m  Wt Readings from Last 3 Encounters:  10/10/23 188 lb (85.3 kg)  09/21/23 185 lb (83.9 kg)  08/09/23 195 lb 6.4 oz (88.6 kg)       Pelvic exam: Vulva:  normal female genitalia Vagina:  normal vagina, no discharge, exudate, lesion, or erythema Cervix:  Non-tender, Negative CMT, no lesions or redness. Uterus:  normal shape, position and consistency    Procedure:  Speculum inserted.   Cervix visualized and cleansed with Chlorhexidine x 3.  Sterile gloves applied. Tenaculum placed on anterior cervix. Unable to advance os finder or cytobrush more than ~ 1 cm. Patient unable to tolerate procedure and requested to stop. Tenaculum and speculum removed.   Chaperone present: Tonita Phoenix, CMA  Patient informed chaperone available to be present for breast and/or pelvic exam. Patient has requested no chaperone to be present. Patient has been advised what will be completed during breast and pelvic exam.   Assessment & Plan:   Problem List Items Addressed This Visit    None Visit Diagnoses       Right lower quadrant pain    -  Primary   Relevant Orders   US PELVIS TRANSVAGINAL NON-OB (TV ONLY)     Pre-procedure lab exam       Relevant Orders   Pregnancy, urine     Unsuccessful IUD insertion         General counseling and advice on female contraception         PCOS (polycystic ovarian syndrome)          Plan: Unable to tolerate IUD insertion. Procedure discontinued. Options reviewed. Worried about forgetting pills, declines Depo due to risk of weight gain. Considering patch or vaginal ring. Would like to continue with Nexplanon at this time. Also considering Megace prn, COCs as needed for prolonged bleeding episodes. Will reach out with a decision. Will schedule pelvic ultrasound for RLQ pain.     Olivia Mackie DNP, 10:32 AM 10/10/2023

## 2023-10-14 ENCOUNTER — Other Ambulatory Visit: Payer: Self-pay

## 2023-10-14 ENCOUNTER — Other Ambulatory Visit (HOSPITAL_BASED_OUTPATIENT_CLINIC_OR_DEPARTMENT_OTHER): Payer: Self-pay

## 2023-10-17 ENCOUNTER — Other Ambulatory Visit: Payer: Self-pay

## 2023-10-17 ENCOUNTER — Other Ambulatory Visit (HOSPITAL_BASED_OUTPATIENT_CLINIC_OR_DEPARTMENT_OTHER): Payer: Self-pay

## 2023-10-17 ENCOUNTER — Encounter: Payer: Self-pay | Admitting: Nurse Practitioner

## 2023-10-17 DIAGNOSIS — F411 Generalized anxiety disorder: Secondary | ICD-10-CM | POA: Diagnosis not present

## 2023-10-17 DIAGNOSIS — F332 Major depressive disorder, recurrent severe without psychotic features: Secondary | ICD-10-CM | POA: Diagnosis not present

## 2023-10-17 MED ORDER — LAMOTRIGINE 150 MG PO TABS
150.0000 mg | ORAL_TABLET | Freq: Every day | ORAL | 1 refills | Status: DC
Start: 2023-10-17 — End: 2023-11-28
  Filled 2023-10-17: qty 30, 30d supply, fill #0
  Filled 2023-11-17: qty 30, 30d supply, fill #1

## 2023-10-17 MED ORDER — PROPRANOLOL HCL 10 MG PO TABS
10.0000 mg | ORAL_TABLET | Freq: Three times a day (TID) | ORAL | 1 refills | Status: DC | PRN
  Filled 2023-10-17: qty 90, 30d supply, fill #0

## 2023-10-18 ENCOUNTER — Other Ambulatory Visit (HOSPITAL_BASED_OUTPATIENT_CLINIC_OR_DEPARTMENT_OTHER): Payer: Self-pay

## 2023-10-18 ENCOUNTER — Other Ambulatory Visit: Payer: Self-pay | Admitting: Nurse Practitioner

## 2023-10-18 DIAGNOSIS — N921 Excessive and frequent menstruation with irregular cycle: Secondary | ICD-10-CM

## 2023-10-18 MED ORDER — MEGESTROL ACETATE 40 MG PO TABS
40.0000 mg | ORAL_TABLET | Freq: Every day | ORAL | 1 refills | Status: AC
  Filled 2023-10-18: qty 10, 10d supply, fill #0
  Filled 2023-11-30: qty 10, 10d supply, fill #1

## 2023-10-18 NOTE — Telephone Encounter (Signed)
Pt was seen on 2/3, scheduled on 2/18. Encounter closed.

## 2023-10-25 ENCOUNTER — Ambulatory Visit (INDEPENDENT_AMBULATORY_CARE_PROVIDER_SITE_OTHER): Payer: Commercial Managed Care - PPO

## 2023-10-25 ENCOUNTER — Encounter: Payer: Self-pay | Admitting: Nurse Practitioner

## 2023-10-25 ENCOUNTER — Ambulatory Visit (INDEPENDENT_AMBULATORY_CARE_PROVIDER_SITE_OTHER): Payer: Commercial Managed Care - PPO | Admitting: Nurse Practitioner

## 2023-10-25 VITALS — BP 108/68 | HR 102

## 2023-10-25 DIAGNOSIS — R1031 Right lower quadrant pain: Secondary | ICD-10-CM

## 2023-10-25 DIAGNOSIS — N644 Mastodynia: Secondary | ICD-10-CM | POA: Diagnosis not present

## 2023-10-25 NOTE — Progress Notes (Signed)
   Acute Office Visit  Subjective:    Patient ID: Alexandra Horton, female    DOB: [birth year], 23 y.o.   MRN: [id number]   HPI 23 y.o. presents today for ultrasound. Complains of intermittent RLQ pain for months. Described as crampy/sharp, random, nothing makes it worse or better. Not cyclic. Nexplanon in place. Reports bilateral breast pain x 3 weeks. Tenderness in outer lower quadrants. Denies lumps, skin changes, and nipple discharge. May be getting better.   Patient's last menstrual period was 09/08/2023.    Review of Systems  Constitutional: Negative.   Gastrointestinal:  Positive for abdominal pain.  Genitourinary: Negative.   Hematological:  Negative for adenopathy.  Right breast: Positive for pain. Negative for mass, nipple discharge, skin changes.  Left breast: Positive for pain. Negative for mass, nipple discharge, skin changes.     Objective:    Physical Exam Constitutional:      Appearance: Normal appearance.  Chest:  Breasts:    Right: Normal.     Left: Normal.     BP 108/68   Pulse (!) 102   LMP 09/08/2023   SpO2 98%  Wt Readings from Last 3 Encounters:  10/10/23 188 lb (85.3 kg)  09/21/23 185 lb (83.9 kg)  08/09/23 195 lb 6.4 oz (88.6 kg)         Assessment & Plan:   Problem List Items Addressed This Visit   None Visit Diagnoses       Right lower quadrant pain    -  Primary     Breast pain in female          Vaginal ultrasound: Anteverted uterus, normal size and shape, no myometrial masses.  Thin, symmetrical endometrium - 2.33 mm.  No masses or thickening seen.  Both ovaries normal size with normal follicle pattern and normal perfusion.  No adnexal masses, no free fluid.  Plan: Normal ultrasound findings reviewed with patient. Pain likely GI or musculoskeletal in nature. Discuss with PCP. Will monitor breast tenderness. Likely hormonal.   Return if symptoms worsen or fail to improve.    Olivia Mackie DNP, 10:13 AM 10/25/2023

## 2023-10-31 ENCOUNTER — Other Ambulatory Visit: Payer: Self-pay

## 2023-10-31 ENCOUNTER — Other Ambulatory Visit (HOSPITAL_BASED_OUTPATIENT_CLINIC_OR_DEPARTMENT_OTHER): Payer: Self-pay

## 2023-10-31 MED ORDER — SUMATRIPTAN SUCCINATE 50 MG PO TABS
50.0000 mg | ORAL_TABLET | Freq: Once | ORAL | 1 refills | Status: AC | PRN
  Filled 2023-10-31: qty 30, 90d supply, fill #0
  Filled 2024-07-23: qty 30, 90d supply, fill #1

## 2023-11-02 ENCOUNTER — Ambulatory Visit: Payer: 59 | Admitting: Nurse Practitioner

## 2023-11-07 DIAGNOSIS — L700 Acne vulgaris: Secondary | ICD-10-CM | POA: Diagnosis not present

## 2023-11-08 ENCOUNTER — Other Ambulatory Visit: Payer: Self-pay | Admitting: Internal Medicine

## 2023-11-08 DIAGNOSIS — R112 Nausea with vomiting, unspecified: Secondary | ICD-10-CM

## 2023-11-26 ENCOUNTER — Encounter: Payer: Self-pay | Admitting: Nurse Practitioner

## 2023-11-28 ENCOUNTER — Other Ambulatory Visit (HOSPITAL_BASED_OUTPATIENT_CLINIC_OR_DEPARTMENT_OTHER): Payer: Self-pay

## 2023-11-28 DIAGNOSIS — F332 Major depressive disorder, recurrent severe without psychotic features: Secondary | ICD-10-CM | POA: Diagnosis not present

## 2023-11-28 DIAGNOSIS — L728 Other follicular cysts of the skin and subcutaneous tissue: Secondary | ICD-10-CM | POA: Diagnosis not present

## 2023-11-28 DIAGNOSIS — F411 Generalized anxiety disorder: Secondary | ICD-10-CM | POA: Diagnosis not present

## 2023-11-28 MED ORDER — LAMOTRIGINE 150 MG PO TABS
150.0000 mg | ORAL_TABLET | Freq: Every day | ORAL | 1 refills | Status: DC
  Filled 2023-12-16: qty 30, 30d supply, fill #0
  Filled 2024-01-11: qty 30, 30d supply, fill #1

## 2023-11-28 MED ORDER — MINOCYCLINE HCL 100 MG PO CAPS
100.0000 mg | ORAL_CAPSULE | Freq: Two times a day (BID) | ORAL | 0 refills | Status: AC
  Filled 2023-11-28: qty 14, 7d supply, fill #0

## 2023-11-30 ENCOUNTER — Other Ambulatory Visit: Payer: Self-pay | Admitting: Nurse Practitioner

## 2023-12-01 ENCOUNTER — Other Ambulatory Visit (HOSPITAL_COMMUNITY): Payer: Self-pay

## 2023-12-01 ENCOUNTER — Other Ambulatory Visit (HOSPITAL_BASED_OUTPATIENT_CLINIC_OR_DEPARTMENT_OTHER): Payer: Self-pay

## 2023-12-06 DIAGNOSIS — L700 Acne vulgaris: Secondary | ICD-10-CM | POA: Diagnosis not present

## 2023-12-08 ENCOUNTER — Other Ambulatory Visit (HOSPITAL_BASED_OUTPATIENT_CLINIC_OR_DEPARTMENT_OTHER): Payer: Self-pay

## 2023-12-08 DIAGNOSIS — L700 Acne vulgaris: Secondary | ICD-10-CM | POA: Diagnosis not present

## 2023-12-08 MED ORDER — ISOTRETINOIN 40 MG PO CAPS
40.0000 mg | ORAL_CAPSULE | Freq: Every day | ORAL | 0 refills | Status: DC
  Filled 2023-12-08: qty 30, 30d supply, fill #0

## 2023-12-09 ENCOUNTER — Other Ambulatory Visit: Payer: Self-pay

## 2023-12-16 ENCOUNTER — Ambulatory Visit: Payer: Commercial Managed Care - PPO | Admitting: Physician Assistant

## 2023-12-16 ENCOUNTER — Other Ambulatory Visit (HOSPITAL_BASED_OUTPATIENT_CLINIC_OR_DEPARTMENT_OTHER): Payer: Self-pay

## 2023-12-20 ENCOUNTER — Encounter (INDEPENDENT_AMBULATORY_CARE_PROVIDER_SITE_OTHER): Payer: Self-pay

## 2023-12-20 ENCOUNTER — Ambulatory Visit: Admitting: Physician Assistant

## 2023-12-20 VITALS — BP 116/78 | HR 95 | Temp 97.7°F | Ht 61.0 in | Wt 181.6 lb

## 2023-12-20 DIAGNOSIS — T7840XA Allergy, unspecified, initial encounter: Secondary | ICD-10-CM | POA: Insufficient documentation

## 2023-12-20 DIAGNOSIS — N809 Endometriosis, unspecified: Secondary | ICD-10-CM | POA: Diagnosis not present

## 2023-12-20 DIAGNOSIS — L700 Acne vulgaris: Secondary | ICD-10-CM

## 2023-12-20 DIAGNOSIS — G43009 Migraine without aura, not intractable, without status migrainosus: Secondary | ICD-10-CM

## 2023-12-20 DIAGNOSIS — E282 Polycystic ovarian syndrome: Secondary | ICD-10-CM | POA: Insufficient documentation

## 2023-12-20 DIAGNOSIS — F909 Attention-deficit hyperactivity disorder, unspecified type: Secondary | ICD-10-CM | POA: Diagnosis not present

## 2023-12-20 DIAGNOSIS — F418 Other specified anxiety disorders: Secondary | ICD-10-CM

## 2023-12-20 DIAGNOSIS — R519 Headache, unspecified: Secondary | ICD-10-CM | POA: Insufficient documentation

## 2023-12-20 DIAGNOSIS — R635 Abnormal weight gain: Secondary | ICD-10-CM | POA: Diagnosis not present

## 2023-12-20 NOTE — Progress Notes (Signed)
 Patient ID: Alexandra Horton, female    DOB: 04/12/2001, 23 y.o.   MRN: [id number]   Assessment & Plan:  Acne vulgaris  Attention deficit hyperactivity disorder (ADHD), unspecified ADHD type  PCOS (polycystic ovarian syndrome) -     Amb Ref to Medical Weight Management  Endometriosis -     Amb Ref to Medical Weight Management  Depression with anxiety  Migraine without aura and without status migrainosus, not intractable  Abnormal weight gain -     Amb Ref to Medical Weight Management      Assessment and Plan Assessment & Plan Weight management Alexandra Horton is experiencing difficulty in losing weight, complicated by insulin resistance, PCOS, and endometriosis. She is interested in medication assistance for weight loss, but her insurance does not cover these medications. Enrollment in a medical weight management program was discussed as an alternative, offering support through nutrition and exercise guidance, which may be more cost-effective given her insurance limitations. - Refer to medical weight management program  Polycystic Ovary Syndrome (PCOS) Alexandra Horton has PCOS, contributing to hormonal acne and weight gain. She is managing symptoms with Nexplanon for birth control but prefers an IUD due to irregular periods and heavy bleeding associated with Nexplanon. She is aware of the potential need for anesthesia or lidocaine for IUD insertion due to previous difficulties. - Continue Nexplanon for birth control - Discuss IUD options with gynecologist, including anesthesia or lidocaine for insertion  Endometriosis Alexandra Horton's endometriosis is managed with Nexplanon. She experiences heavy menstrual bleeding and irregular periods, controlled with birth control. She expressed dissatisfaction with birth control but acknowledges its necessity for managing her condition. - Continue Nexplanon for endometriosis management  Acne Alexandra Horton is on Accutane for cystic acne, exacerbated by hormonal changes due  to PCOS. She experiences very dry skin and lips as side effects and is experimenting with different moisturizers and face washes to manage these. - Continue Accutane for acne management with dermatology - Experiment with different moisturizers and face washes to manage dry skin  Migraines Alexandra Horton experiences migraines approximately once or twice a month, managed with sumatriptan (Imitrex) and promethazine (Phenergan) as needed. There is a family history of migraines on her mother's side. - Refill sumatriptan (Imitrex) as needed - Refill promethazine (Phenergan) as needed  Attention-Deficit/Hyperactivity Disorder (ADHD) Alexandra Horton was diagnosed with ADHD in childhood and is considering resuming medication. She is currently not on any ADHD medication and is in the process of having her records sent to her psychiatrist. - Coordinate with psychiatrist regarding ADHD medication records  General Health Maintenance Alexandra Horton's labs were last checked in October. A yearly physical and lab work are planned for October, focusing on cholesterol levels. She was advised to fast before the appointment for more accurate results, but due to her history of fainting when fasting, it was agreed to proceed without fasting if necessary. - Schedule physical and lab work in October - Advise fasting before labs if possible, but prioritize safety if fasting is not tolerated  Follow-up Alexandra Horton is scheduled to follow up in six months for a physical. She is advised to reach out if she needs refills on her medications or if any issues arise before then. - Follow up in six months for a physical - Reach out for medication refills or if issues arise      Return in about 6 months (around 06/20/2024) for physical, labs .    Subjective:    Chief Complaint  Patient presents with   New Patient (Initial Visit)  Pt in office to est care with PCP;     HPI Discussed the use of AI scribe software for clinical note  transcription with the patient, who gave verbal consent to proceed.  History of Present Illness Alexandra Horton is a 23 year old female with insulin resistance, PCOS, and endometriosis who presents with difficulty in losing weight.  She attributes her difficulty in losing weight to her history of insulin resistance, PCOS, and endometriosis. Her weight fluctuates, and she struggles to maintain weight loss, particularly after starting birth control. She lost 10-15 pounds at the end of 2022 but has since regained the weight. She is interested in medication assistance for weight loss, but her insurance does not cover it.  She has a history of PCOS and endometriosis, contributing to hormonal acne and weight management issues. She is currently using Nexplanon for birth control but is dissatisfied due to irregular and prolonged periods. She previously tried an IUD but experienced significant pain during insertion despite using Cytotec. She is considering switching back to an IUD with anesthesia or lidocaine for pain management.  She has been dealing with acne since her teenage years, which has worsened into cystic acne in her twenties. She started Accutane two weeks ago and reports very dry skin and lips. She has tried various moisturizers and face washes to manage these side effects. Prior to Accutane, she used topical acne medications and antibiotics without success.  She experiences migraines once or twice a month and uses sumatriptan and promethazine as needed for relief. Her maternal grandfather also suffers from migraines.  She has a history of ADHD, diagnosed before kindergarten, and has been managing it without medication recently. She is considering resuming ADHD medication and is in the process of transferring her records to her psychiatrist.  Her current medications include Lamictal and propranolol, managed by her psychiatrist, and Wellbutrin, which is also prescribed by her psychiatrist. She is not  currently taking Concerta.  Specialists: Follows with GYN, Dermatology, and Psychiatry   Past Medical History:  Diagnosis Date   ADHD (attention deficit hyperactivity disorder)    Dr. Verdie Mosher   Anemia    Anxiety    Depression    Environmental allergies     Past Surgical History:  Procedure Laterality Date   WISDOM TOOTH EXTRACTION  2019    Family History  Problem Relation Age of Onset   Allergic rhinitis Mother    ADD / ADHD Mother    Allergic rhinitis Father    Hypertension Father    Migraines Father    ADD / ADHD Father    Depression Father    ADD / ADHD Brother    Diabetes type I Brother    Depression Brother    Diabetes Brother    Migraines Maternal Aunt    ADD / ADHD Maternal Aunt    Hypothyroidism Paternal Aunt    Asthma Maternal Grandmother    Migraines Maternal Grandmother    ADD / ADHD Maternal Grandmother    Arthritis Maternal Grandmother    ADD / ADHD Maternal Grandfather    ADD / ADHD Paternal Grandmother    ADD / ADHD Paternal Grandfather    Autism Cousin        Maternal 1st Cousin   ADD / ADHD Cousin        Several Paternal 1st Cousins have ADHD   ADD / ADHD Maternal Aunt    ADD / ADHD Paternal Aunt    ADD / ADHD Paternal Uncle  ADD / ADHD Paternal Aunt    ADD / ADHD Paternal Uncle     Social History   Tobacco Use   Smoking status: Never    Passive exposure: Never   Smokeless tobacco: Never  Vaping Use   Vaping status: Some Days  Substance Use Topics   Alcohol use: Yes    Comment: occasionally   Drug use: Never     Allergies  Allergen Reactions   Strawberry Flavoring Agent (Non-Screening) Hives   Lactose Intolerance (Gi)     Review of Systems NEGATIVE UNLESS OTHERWISE INDICATED IN HPI      Objective:     BP 116/78 (BP Location: Left Arm, Patient Position: Sitting, Cuff Size: Normal)   Pulse 95   Temp 97.7 F (36.5 C) (Temporal)   Ht 5\' 1"  (1.549 m)   Wt 181 lb 9.6 oz (82.4 kg)   SpO2 99%   BMI 34.31 kg/m   Wt  Readings from Last 3 Encounters:  12/20/23 181 lb 9.6 oz (82.4 kg)  10/10/23 188 lb (85.3 kg)  09/21/23 185 lb (83.9 kg)    BP Readings from Last 3 Encounters:  12/20/23 116/78  10/25/23 108/68  10/10/23 112/76     Physical Exam Vitals and nursing note reviewed.  Constitutional:      General: She is not in acute distress.    Appearance: Normal appearance. She is obese. She is not ill-appearing.  HENT:     Head: Normocephalic.     Right Ear: External ear normal.     Left Ear: External ear normal.  Eyes:     Extraocular Movements: Extraocular movements intact.     Conjunctiva/sclera: Conjunctivae normal.     Pupils: Pupils are equal, round, and reactive to light.  Cardiovascular:     Rate and Rhythm: Normal rate and regular rhythm.     Pulses: Normal pulses.     Heart sounds: Normal heart sounds. No murmur heard. Pulmonary:     Effort: Pulmonary effort is normal. No respiratory distress.     Breath sounds: Normal breath sounds. No wheezing.  Musculoskeletal:     Cervical back: Normal range of motion.  Skin:    General: Skin is warm.     Findings: Lesion (facial acne noted) present.  Neurological:     Mental Status: She is alert and oriented to person, place, and time.     Comments: Motor tics noted  Psychiatric:        Mood and Affect: Mood normal.        Behavior: Behavior normal.             Alexandra Jaquess M Louise Victory, PA-C

## 2023-12-22 ENCOUNTER — Encounter: Payer: Self-pay | Admitting: Nurse Practitioner

## 2023-12-22 ENCOUNTER — Ambulatory Visit: Payer: 59 | Admitting: Nurse Practitioner

## 2023-12-27 NOTE — Telephone Encounter (Signed)
 Routing to [facility] appts to assist with rescheduling AEX.

## 2023-12-30 ENCOUNTER — Other Ambulatory Visit (HOSPITAL_BASED_OUTPATIENT_CLINIC_OR_DEPARTMENT_OTHER): Payer: Self-pay

## 2024-01-01 ENCOUNTER — Encounter: Payer: Self-pay | Admitting: Nurse Practitioner

## 2024-01-01 DIAGNOSIS — Z30430 Encounter for insertion of intrauterine contraceptive device: Secondary | ICD-10-CM

## 2024-01-03 ENCOUNTER — Other Ambulatory Visit (HOSPITAL_BASED_OUTPATIENT_CLINIC_OR_DEPARTMENT_OTHER): Payer: Self-pay

## 2024-01-03 DIAGNOSIS — F320 Major depressive disorder, single episode, mild: Secondary | ICD-10-CM | POA: Diagnosis not present

## 2024-01-10 ENCOUNTER — Other Ambulatory Visit (HOSPITAL_BASED_OUTPATIENT_CLINIC_OR_DEPARTMENT_OTHER): Payer: Self-pay

## 2024-01-10 DIAGNOSIS — K130 Diseases of lips: Secondary | ICD-10-CM | POA: Diagnosis not present

## 2024-01-10 DIAGNOSIS — L853 Xerosis cutis: Secondary | ICD-10-CM | POA: Diagnosis not present

## 2024-01-10 DIAGNOSIS — R040 Epistaxis: Secondary | ICD-10-CM | POA: Diagnosis not present

## 2024-01-10 DIAGNOSIS — F320 Major depressive disorder, single episode, mild: Secondary | ICD-10-CM | POA: Diagnosis not present

## 2024-01-10 DIAGNOSIS — L700 Acne vulgaris: Secondary | ICD-10-CM | POA: Diagnosis not present

## 2024-01-10 MED ORDER — ISOTRETINOIN 30 MG PO CAPS
30.0000 mg | ORAL_CAPSULE | Freq: Two times a day (BID) | ORAL | 0 refills | Status: AC
Start: 2024-01-10 — End: ?
  Filled 2024-01-10 – 2024-01-12 (×8): qty 60, 30d supply, fill #0

## 2024-01-11 ENCOUNTER — Other Ambulatory Visit (HOSPITAL_BASED_OUTPATIENT_CLINIC_OR_DEPARTMENT_OTHER): Payer: Self-pay

## 2024-01-12 ENCOUNTER — Other Ambulatory Visit: Payer: Self-pay

## 2024-01-12 ENCOUNTER — Other Ambulatory Visit (HOSPITAL_BASED_OUTPATIENT_CLINIC_OR_DEPARTMENT_OTHER): Payer: Self-pay

## 2024-01-13 ENCOUNTER — Other Ambulatory Visit: Payer: Self-pay

## 2024-01-16 DIAGNOSIS — F320 Major depressive disorder, single episode, mild: Secondary | ICD-10-CM | POA: Diagnosis not present

## 2024-01-17 ENCOUNTER — Other Ambulatory Visit (HOSPITAL_BASED_OUTPATIENT_CLINIC_OR_DEPARTMENT_OTHER): Payer: Self-pay

## 2024-01-17 DIAGNOSIS — L271 Localized skin eruption due to drugs and medicaments taken internally: Secondary | ICD-10-CM | POA: Diagnosis not present

## 2024-01-17 MED ORDER — HYDROCORTISONE 2.5 % EX OINT
1.0000 | TOPICAL_OINTMENT | Freq: Two times a day (BID) | CUTANEOUS | 3 refills | Status: DC | PRN
  Filled 2024-01-17: qty 28.35, 30d supply, fill #0

## 2024-01-17 NOTE — Telephone Encounter (Signed)
 Tiffany -have you reviewed with Dr. Tia Flowers or Dr. Andrena Ke? I will need a surgery referral.

## 2024-01-17 NOTE — Telephone Encounter (Signed)
 Routing to Dr. Tia Flowers to place surgery referral.

## 2024-01-17 NOTE — Telephone Encounter (Signed)
 Yes, I discussed with Dr. Tia Flowers previously and she is agreeable.

## 2024-01-18 ENCOUNTER — Other Ambulatory Visit: Payer: Self-pay | Admitting: Internal Medicine

## 2024-01-18 ENCOUNTER — Other Ambulatory Visit: Payer: Self-pay | Admitting: Physician Assistant

## 2024-01-18 ENCOUNTER — Encounter: Payer: Self-pay | Admitting: Physician Assistant

## 2024-01-18 DIAGNOSIS — R112 Nausea with vomiting, unspecified: Secondary | ICD-10-CM

## 2024-01-18 MED ORDER — LEVONORGESTREL 20 MCG/DAY IU IUD: 1.0000 | INTRAUTERINE_SYSTEM | Freq: Once | INTRAUTERINE | Status: DC

## 2024-01-18 MED ORDER — PROMETHAZINE HCL 25 MG PO TABS
25.0000 mg | ORAL_TABLET | Freq: Four times a day (QID) | ORAL | 0 refills | Status: DC | PRN
  Filled 2024-01-18: qty 30, 8d supply, fill #0

## 2024-01-18 NOTE — Telephone Encounter (Signed)
 Please see pt request and advise

## 2024-01-18 NOTE — Telephone Encounter (Signed)
 Placing referral now Dr. Tia Flowers   10/25/23 PUS revieweid  Vaginal ultrasound:   Anteverted uterus, normal size and shape, no myometrial masses.  Thin, symmetrical endometrium - 2.33 mm.  No masses or thickening seen.     Both ovaries normal size with normal follicle pattern and normal perfusion.     No adnexal masses, no free fluid.

## 2024-01-19 ENCOUNTER — Other Ambulatory Visit (HOSPITAL_BASED_OUTPATIENT_CLINIC_OR_DEPARTMENT_OTHER): Payer: Self-pay

## 2024-01-25 ENCOUNTER — Encounter: Payer: Self-pay | Admitting: Nurse Practitioner

## 2024-01-26 ENCOUNTER — Other Ambulatory Visit (HOSPITAL_BASED_OUTPATIENT_CLINIC_OR_DEPARTMENT_OTHER): Payer: Self-pay

## 2024-01-26 ENCOUNTER — Other Ambulatory Visit: Payer: Self-pay | Admitting: Nurse Practitioner

## 2024-01-26 DIAGNOSIS — N939 Abnormal uterine and vaginal bleeding, unspecified: Secondary | ICD-10-CM

## 2024-01-26 MED ORDER — MEGESTROL ACETATE 40 MG PO TABS
40.0000 mg | ORAL_TABLET | Freq: Every day | ORAL | 0 refills | Status: DC
  Filled 2024-01-26: qty 10, 10d supply, fill #0

## 2024-02-08 ENCOUNTER — Other Ambulatory Visit (HOSPITAL_BASED_OUTPATIENT_CLINIC_OR_DEPARTMENT_OTHER): Payer: Self-pay

## 2024-02-09 ENCOUNTER — Other Ambulatory Visit (HOSPITAL_BASED_OUTPATIENT_CLINIC_OR_DEPARTMENT_OTHER): Payer: Self-pay

## 2024-02-09 DIAGNOSIS — R040 Epistaxis: Secondary | ICD-10-CM | POA: Diagnosis not present

## 2024-02-09 DIAGNOSIS — L700 Acne vulgaris: Secondary | ICD-10-CM | POA: Diagnosis not present

## 2024-02-09 DIAGNOSIS — L853 Xerosis cutis: Secondary | ICD-10-CM | POA: Diagnosis not present

## 2024-02-09 DIAGNOSIS — K130 Diseases of lips: Secondary | ICD-10-CM | POA: Diagnosis not present

## 2024-02-09 MED ORDER — ISOTRETINOIN 40 MG PO CAPS
40.0000 mg | ORAL_CAPSULE | Freq: Two times a day (BID) | ORAL | 0 refills | Status: DC
  Filled 2024-02-09: qty 60, 30d supply, fill #0

## 2024-02-10 ENCOUNTER — Encounter (HOSPITAL_BASED_OUTPATIENT_CLINIC_OR_DEPARTMENT_OTHER): Payer: Self-pay

## 2024-02-10 ENCOUNTER — Other Ambulatory Visit (HOSPITAL_BASED_OUTPATIENT_CLINIC_OR_DEPARTMENT_OTHER): Payer: Self-pay

## 2024-02-14 ENCOUNTER — Ambulatory Visit: Admitting: Nurse Practitioner

## 2024-02-16 ENCOUNTER — Other Ambulatory Visit (HOSPITAL_BASED_OUTPATIENT_CLINIC_OR_DEPARTMENT_OTHER): Payer: Self-pay

## 2024-02-27 ENCOUNTER — Encounter: Payer: Self-pay | Admitting: Nurse Practitioner

## 2024-03-02 ENCOUNTER — Other Ambulatory Visit: Payer: Self-pay | Admitting: Physician Assistant

## 2024-03-02 ENCOUNTER — Other Ambulatory Visit (HOSPITAL_BASED_OUTPATIENT_CLINIC_OR_DEPARTMENT_OTHER): Payer: Self-pay

## 2024-03-02 DIAGNOSIS — F988 Other specified behavioral and emotional disorders with onset usually occurring in childhood and adolescence: Secondary | ICD-10-CM

## 2024-03-02 NOTE — Telephone Encounter (Signed)
 Please advise on refills from previous providers; only seen in our office once to est care 12/20/23

## 2024-03-05 ENCOUNTER — Other Ambulatory Visit (HOSPITAL_BASED_OUTPATIENT_CLINIC_OR_DEPARTMENT_OTHER): Payer: Self-pay

## 2024-03-05 ENCOUNTER — Telehealth: Payer: Self-pay | Admitting: Physician Assistant

## 2024-03-05 MED ORDER — LAMOTRIGINE 150 MG PO TABS
150.0000 mg | ORAL_TABLET | Freq: Every day | ORAL | 1 refills | Status: AC
  Filled 2024-03-05: qty 30, 30d supply, fill #0
  Filled 2024-05-23: qty 30, 30d supply, fill #1

## 2024-03-05 NOTE — Telephone Encounter (Signed)
 Copied from CRM [id number]. Topic: Clinical - Medication Refill >> Mar 05, 2024  1:46 PM Chiquita SQUIBB wrote: Medication: Rx #: [id number]  lamoTRIgine  (LAMICTAL ) 150 MG tablet [[id number]]    Patient can not get in touch with her mental health doctor and is out of medication, patient states she can not stop it suddenly. Patient has an appointment next week on July 9 and asking if this can be filled till then at least by her PCP.   Has the patient contacted their pharmacy? Yes (Agent: If no, request that the patient contact the pharmacy for the refill. If patient does not wish to contact the pharmacy document the reason why and proceed with request.) (Agent: If yes, when and what did the pharmacy advise?)  This is the patient's preferred pharmacy:  [facility] [address] [place] [address] Phone: [phone] Fax: [phone]  Is this the correct pharmacy for this prescription? Yes If no, delete pharmacy and type the correct one.   Has the prescription been filled recently? No  Is the patient out of the medication? Yes  Has the patient been seen for an appointment in the last year OR does the patient have an upcoming appointment? Yes  Can we respond through MyChart? Yes  Agent: Please be advised that Rx refills may take up to 3 business days. We ask that you follow-up with your pharmacy.

## 2024-03-07 ENCOUNTER — Encounter (HOSPITAL_BASED_OUTPATIENT_CLINIC_OR_DEPARTMENT_OTHER): Payer: Self-pay

## 2024-03-07 ENCOUNTER — Other Ambulatory Visit (HOSPITAL_BASED_OUTPATIENT_CLINIC_OR_DEPARTMENT_OTHER): Payer: Self-pay

## 2024-03-08 ENCOUNTER — Other Ambulatory Visit: Payer: Self-pay

## 2024-03-08 ENCOUNTER — Other Ambulatory Visit (HOSPITAL_BASED_OUTPATIENT_CLINIC_OR_DEPARTMENT_OTHER): Payer: Self-pay

## 2024-03-08 DIAGNOSIS — R040 Epistaxis: Secondary | ICD-10-CM | POA: Diagnosis not present

## 2024-03-08 DIAGNOSIS — L700 Acne vulgaris: Secondary | ICD-10-CM | POA: Diagnosis not present

## 2024-03-08 DIAGNOSIS — L728 Other follicular cysts of the skin and subcutaneous tissue: Secondary | ICD-10-CM | POA: Diagnosis not present

## 2024-03-08 DIAGNOSIS — K130 Diseases of lips: Secondary | ICD-10-CM | POA: Diagnosis not present

## 2024-03-08 DIAGNOSIS — L853 Xerosis cutis: Secondary | ICD-10-CM | POA: Diagnosis not present

## 2024-03-08 MED ORDER — ISOTRETINOIN 40 MG PO CAPS
40.0000 mg | ORAL_CAPSULE | Freq: Two times a day (BID) | ORAL | 0 refills | Status: DC
  Filled 2024-03-08: qty 60, 30d supply, fill #0

## 2024-03-12 ENCOUNTER — Other Ambulatory Visit: Payer: Self-pay | Admitting: Physician Assistant

## 2024-03-12 ENCOUNTER — Other Ambulatory Visit (HOSPITAL_BASED_OUTPATIENT_CLINIC_OR_DEPARTMENT_OTHER): Payer: Self-pay

## 2024-03-12 ENCOUNTER — Encounter: Payer: Self-pay | Admitting: Physician Assistant

## 2024-03-12 DIAGNOSIS — F988 Other specified behavioral and emotional disorders with onset usually occurring in childhood and adolescence: Secondary | ICD-10-CM

## 2024-03-12 MED ORDER — PROPRANOLOL HCL 10 MG PO TABS
10.0000 mg | ORAL_TABLET | Freq: Three times a day (TID) | ORAL | 1 refills | Status: AC | PRN
  Filled 2024-03-12: qty 90, 30d supply, fill #0
  Filled 2024-07-23: qty 90, 30d supply, fill #1

## 2024-03-12 MED FILL — Bupropion HCl Tab ER 24HR 150 MG: ORAL | 90 days supply | Qty: 90 | Fill #0 | Status: AC

## 2024-03-13 ENCOUNTER — Other Ambulatory Visit (HOSPITAL_BASED_OUTPATIENT_CLINIC_OR_DEPARTMENT_OTHER): Payer: Self-pay

## 2024-03-14 ENCOUNTER — Other Ambulatory Visit (HOSPITAL_BASED_OUTPATIENT_CLINIC_OR_DEPARTMENT_OTHER): Payer: Self-pay

## 2024-03-14 MED ORDER — LAMOTRIGINE 150 MG PO TABS
150.0000 mg | ORAL_TABLET | Freq: Every day | ORAL | 1 refills | Status: DC
  Filled 2024-03-14 – 2024-04-21 (×2): qty 30, 30d supply, fill #0
  Filled 2024-07-23: qty 30, 30d supply, fill #1

## 2024-03-14 MED ORDER — PROPRANOLOL HCL 10 MG PO TABS
10.0000 mg | ORAL_TABLET | Freq: Three times a day (TID) | ORAL | 1 refills | Status: AC | PRN
  Filled 2024-03-14: qty 90, 30d supply, fill #0

## 2024-03-18 ENCOUNTER — Other Ambulatory Visit: Payer: Self-pay | Admitting: Nurse Practitioner

## 2024-03-18 DIAGNOSIS — N939 Abnormal uterine and vaginal bleeding, unspecified: Secondary | ICD-10-CM

## 2024-03-19 ENCOUNTER — Other Ambulatory Visit (HOSPITAL_BASED_OUTPATIENT_CLINIC_OR_DEPARTMENT_OTHER): Payer: Self-pay

## 2024-03-19 MED ORDER — MEGESTROL ACETATE 40 MG PO TABS
40.0000 mg | ORAL_TABLET | Freq: Every day | ORAL | 0 refills | Status: DC
  Filled 2024-03-19: qty 10, 10d supply, fill #0

## 2024-03-19 NOTE — Telephone Encounter (Signed)
 Medication refill request: megace  40mg  Last AEX:  09-28-22 Last OV: 10-25-23 Next AEX: 04-25-24 Last MMG (if hormonal medication request): n/a Refill authorized: please approve or deny as appropriate

## 2024-03-23 NOTE — Telephone Encounter (Signed)
 Advised on Rx valium, tramadol and cytotec  as previously suggested. Patient is scheduled for 7/25 at 1130 for IUD insert.   Cc: Tiffany

## 2024-03-26 ENCOUNTER — Other Ambulatory Visit (HOSPITAL_BASED_OUTPATIENT_CLINIC_OR_DEPARTMENT_OTHER): Payer: Self-pay

## 2024-03-26 ENCOUNTER — Other Ambulatory Visit: Payer: Self-pay | Admitting: Nurse Practitioner

## 2024-03-26 DIAGNOSIS — F419 Anxiety disorder, unspecified: Secondary | ICD-10-CM

## 2024-03-26 DIAGNOSIS — Z30430 Encounter for insertion of intrauterine contraceptive device: Secondary | ICD-10-CM

## 2024-03-26 MED ORDER — MISOPROSTOL 200 MCG PO TABS
400.0000 ug | ORAL_TABLET | Freq: Once | ORAL | 0 refills | Status: AC
  Filled 2024-03-26: qty 2, 1d supply, fill #0

## 2024-03-26 MED ORDER — DIAZEPAM 5 MG PO TABS
5.0000 mg | ORAL_TABLET | Freq: Once | ORAL | 0 refills | Status: AC
  Filled 2024-03-26: qty 1, 1d supply, fill #0

## 2024-03-26 MED ORDER — TRAMADOL HCL 50 MG PO TABS
100.0000 mg | ORAL_TABLET | Freq: Once | ORAL | 0 refills | Status: AC
  Filled 2024-03-26: qty 2, 1d supply, fill #0

## 2024-03-26 NOTE — Telephone Encounter (Signed)
 One time doses of Valium , Tramadol  and Cytotec  have been sent to her pharmacy. This will also be a Nexplanon  removal.

## 2024-03-26 NOTE — Telephone Encounter (Signed)
 Call placed to patient. Left detailed message ok per dpr. Advised per Tiffany. Advised will need to return call to complete procedure consent prior to day of procedure, return call to [phone], opt 4.

## 2024-03-29 NOTE — Telephone Encounter (Signed)
 Patient has not signed informed consent, IUD insertion scheduled for 03/30/24.   Call placed to patient left message requesting return call in regard to appt scheduled for 03/30/24.   MyChart message also sent.

## 2024-03-29 NOTE — Telephone Encounter (Signed)
 Call placed to patient with Joy, CMA on the line. Verbal consent obtained for IUD insertion and nexplanon  removal. Consent completed and to Jada.   Medications reviewed: Cytotec , valium  and tramadol . No additional questions. Appt confirmed with Dr. Glennon for 7/25 at 1130, arrive at 1115. Patient verbalizes understanding and is agreeable.   Routing to provider for final review. Patient is agreeable to disposition. Will close encounter.

## 2024-03-30 ENCOUNTER — Encounter: Payer: Self-pay | Admitting: Obstetrics and Gynecology

## 2024-03-30 ENCOUNTER — Ambulatory Visit (INDEPENDENT_AMBULATORY_CARE_PROVIDER_SITE_OTHER): Admitting: Obstetrics and Gynecology

## 2024-03-30 VITALS — BP 122/84 | HR 110

## 2024-03-30 DIAGNOSIS — Z01812 Encounter for preprocedural laboratory examination: Secondary | ICD-10-CM

## 2024-03-30 DIAGNOSIS — N898 Other specified noninflammatory disorders of vagina: Secondary | ICD-10-CM

## 2024-03-30 DIAGNOSIS — Z30430 Encounter for insertion of intrauterine contraceptive device: Secondary | ICD-10-CM | POA: Diagnosis not present

## 2024-03-30 LAB — PREGNANCY, URINE: Preg Test, Ur: NEGATIVE

## 2024-03-30 NOTE — Progress Notes (Signed)
   Acute Office Visit  Subjective:    Patient ID: Alexandra Horton, female    DOB: 2001/08/18, 23 y.o.   MRN: [id number]   HPI 23 y.o. presents today for Office Visit (IUD Insertion and nexplanon  removal ) . Nexplanon  has been in place for 2 years, but is having constant DUB. Has taken tramadol , cytotec  and valium    No LMP recorded (lmp unknown). Patient has had an implant.    Review of Systems     Objective:    OBGyn Exam  BP 122/84 (BP Location: Left Arm, Patient Position: Sitting)   Pulse (!) 110   LMP  (LMP Unknown)   SpO2 97%  Wt Readings from Last 3 Encounters:  12/20/23 181 lb 9.6 oz (82.4 kg)  10/10/23 188 lb (85.3 kg)  09/21/23 185 lb (83.9 kg)        Alexandra Horton, CMA was present for the procedure Time out performed. Patient consent obtained with written consent. SVE: lidocaine sprayed on speculum and on the cervix. Single tooth tenaculum placed for IUD insertion and patient states she could not take the procedure and wanted to stop.  Alexandra Horton presented to support patient Os finder passed easily and she still declined and procedure stopped. She wanted to keep her nexplanon  in for now.  Assessment & Plan:  DUB Attempted mirena  IUD placement with no success due to patient not being able to tolerate procedure beyond the single tooth tenaculum placement. Nexplanon  in place Return to discuss options with Alexandra Horton

## 2024-03-31 LAB — SURESWAB® ADVANCED VAGINITIS PLUS,TMA
C. trachomatis RNA, TMA: NOT DETECTED
CANDIDA SPECIES: NOT DETECTED
Candida glabrata: NOT DETECTED
N. gonorrhoeae RNA, TMA: NOT DETECTED
SURESWAB(R) ADV BACTERIAL VAGINOSIS(BV),TMA: POSITIVE — AB
TRICHOMONAS VAGINALIS (TV),TMA: NOT DETECTED

## 2024-04-02 ENCOUNTER — Other Ambulatory Visit (HOSPITAL_BASED_OUTPATIENT_CLINIC_OR_DEPARTMENT_OTHER): Payer: Self-pay

## 2024-04-02 ENCOUNTER — Ambulatory Visit: Payer: Self-pay | Admitting: Obstetrics and Gynecology

## 2024-04-02 MED ORDER — METRONIDAZOLE 500 MG PO TABS
500.0000 mg | ORAL_TABLET | Freq: Two times a day (BID) | ORAL | 0 refills | Status: AC
  Filled 2024-04-02: qty 14, 7d supply, fill #0

## 2024-04-09 ENCOUNTER — Other Ambulatory Visit: Payer: Self-pay

## 2024-04-09 ENCOUNTER — Other Ambulatory Visit (HOSPITAL_BASED_OUTPATIENT_CLINIC_OR_DEPARTMENT_OTHER): Payer: Self-pay

## 2024-04-09 DIAGNOSIS — R040 Epistaxis: Secondary | ICD-10-CM | POA: Diagnosis not present

## 2024-04-09 DIAGNOSIS — L853 Xerosis cutis: Secondary | ICD-10-CM | POA: Diagnosis not present

## 2024-04-09 DIAGNOSIS — Z789 Other specified health status: Secondary | ICD-10-CM | POA: Diagnosis not present

## 2024-04-09 DIAGNOSIS — L700 Acne vulgaris: Secondary | ICD-10-CM | POA: Diagnosis not present

## 2024-04-09 DIAGNOSIS — L728 Other follicular cysts of the skin and subcutaneous tissue: Secondary | ICD-10-CM | POA: Diagnosis not present

## 2024-04-09 DIAGNOSIS — K130 Diseases of lips: Secondary | ICD-10-CM | POA: Diagnosis not present

## 2024-04-09 MED ORDER — ISOTRETINOIN 40 MG PO CAPS
40.0000 mg | ORAL_CAPSULE | Freq: Two times a day (BID) | ORAL | 0 refills | Status: DC
  Filled 2024-04-09: qty 60, 30d supply, fill #0

## 2024-04-10 ENCOUNTER — Other Ambulatory Visit (HOSPITAL_BASED_OUTPATIENT_CLINIC_OR_DEPARTMENT_OTHER): Payer: Self-pay

## 2024-04-11 ENCOUNTER — Other Ambulatory Visit (HOSPITAL_BASED_OUTPATIENT_CLINIC_OR_DEPARTMENT_OTHER): Payer: Self-pay

## 2024-04-12 ENCOUNTER — Other Ambulatory Visit (HOSPITAL_BASED_OUTPATIENT_CLINIC_OR_DEPARTMENT_OTHER): Payer: Self-pay

## 2024-04-12 DIAGNOSIS — F339 Major depressive disorder, recurrent, unspecified: Secondary | ICD-10-CM | POA: Diagnosis not present

## 2024-04-14 ENCOUNTER — Other Ambulatory Visit (HOSPITAL_BASED_OUTPATIENT_CLINIC_OR_DEPARTMENT_OTHER): Payer: Self-pay

## 2024-04-16 ENCOUNTER — Other Ambulatory Visit (HOSPITAL_BASED_OUTPATIENT_CLINIC_OR_DEPARTMENT_OTHER): Payer: Self-pay

## 2024-04-16 ENCOUNTER — Other Ambulatory Visit: Payer: Self-pay

## 2024-04-16 MED ORDER — ERYTHROMYCIN BASE 500 MG PO TABS
500.0000 mg | ORAL_TABLET | Freq: Two times a day (BID) | ORAL | 0 refills | Status: DC
  Filled 2024-04-16: qty 14, 7d supply, fill #0

## 2024-04-19 DIAGNOSIS — F339 Major depressive disorder, recurrent, unspecified: Secondary | ICD-10-CM | POA: Diagnosis not present

## 2024-04-22 ENCOUNTER — Other Ambulatory Visit (HOSPITAL_BASED_OUTPATIENT_CLINIC_OR_DEPARTMENT_OTHER): Payer: Self-pay

## 2024-04-24 ENCOUNTER — Encounter: Payer: Self-pay | Admitting: Physician Assistant

## 2024-04-25 ENCOUNTER — Ambulatory Visit: Admitting: Nurse Practitioner

## 2024-04-26 NOTE — Telephone Encounter (Signed)
 Please see pt response.

## 2024-05-18 ENCOUNTER — Other Ambulatory Visit (HOSPITAL_BASED_OUTPATIENT_CLINIC_OR_DEPARTMENT_OTHER): Payer: Self-pay

## 2024-05-18 DIAGNOSIS — R040 Epistaxis: Secondary | ICD-10-CM | POA: Diagnosis not present

## 2024-05-18 DIAGNOSIS — L739 Follicular disorder, unspecified: Secondary | ICD-10-CM | POA: Diagnosis not present

## 2024-05-18 DIAGNOSIS — L853 Xerosis cutis: Secondary | ICD-10-CM | POA: Diagnosis not present

## 2024-05-18 DIAGNOSIS — K130 Diseases of lips: Secondary | ICD-10-CM | POA: Diagnosis not present

## 2024-05-18 DIAGNOSIS — L700 Acne vulgaris: Secondary | ICD-10-CM | POA: Diagnosis not present

## 2024-05-18 MED ORDER — CLINDAMYCIN PHOSPHATE 1 % EX SOLN
Freq: Two times a day (BID) | CUTANEOUS | 11 refills | Status: DC
  Filled 2024-05-18: qty 60, 30d supply, fill #0
  Filled 2024-08-24: qty 60, 30d supply, fill #1

## 2024-05-18 MED ORDER — ISOTRETINOIN 40 MG PO CAPS
40.0000 mg | ORAL_CAPSULE | Freq: Two times a day (BID) | ORAL | 0 refills | Status: DC
  Filled 2024-05-18: qty 60, 30d supply, fill #0

## 2024-05-21 ENCOUNTER — Other Ambulatory Visit (HOSPITAL_BASED_OUTPATIENT_CLINIC_OR_DEPARTMENT_OTHER): Payer: Self-pay

## 2024-05-23 ENCOUNTER — Other Ambulatory Visit: Payer: Self-pay | Admitting: Nurse Practitioner

## 2024-05-23 ENCOUNTER — Encounter: Payer: Self-pay | Admitting: Physician Assistant

## 2024-05-23 ENCOUNTER — Other Ambulatory Visit (HOSPITAL_BASED_OUTPATIENT_CLINIC_OR_DEPARTMENT_OTHER): Payer: Self-pay

## 2024-05-23 ENCOUNTER — Other Ambulatory Visit: Payer: Self-pay

## 2024-05-23 DIAGNOSIS — Z30430 Encounter for insertion of intrauterine contraceptive device: Secondary | ICD-10-CM

## 2024-05-23 NOTE — Telephone Encounter (Signed)
 Medication refill request: cytotec   Last AEX:  10/25/23  Next AEX: not scheduled  Last MMG (if hormonal medication request): n/a Refill authorized: rx refused.

## 2024-05-28 ENCOUNTER — Other Ambulatory Visit: Payer: Self-pay | Admitting: Nurse Practitioner

## 2024-05-28 DIAGNOSIS — N939 Abnormal uterine and vaginal bleeding, unspecified: Secondary | ICD-10-CM

## 2024-05-29 ENCOUNTER — Other Ambulatory Visit (HOSPITAL_COMMUNITY): Payer: Self-pay

## 2024-05-29 ENCOUNTER — Other Ambulatory Visit: Payer: Self-pay

## 2024-05-29 ENCOUNTER — Encounter: Payer: Self-pay | Admitting: Nurse Practitioner

## 2024-05-29 ENCOUNTER — Encounter: Payer: Self-pay | Admitting: Pharmacist

## 2024-05-29 DIAGNOSIS — R208 Other disturbances of skin sensation: Secondary | ICD-10-CM | POA: Diagnosis not present

## 2024-05-29 DIAGNOSIS — L538 Other specified erythematous conditions: Secondary | ICD-10-CM | POA: Diagnosis not present

## 2024-05-29 DIAGNOSIS — Z789 Other specified health status: Secondary | ICD-10-CM | POA: Diagnosis not present

## 2024-05-29 DIAGNOSIS — L728 Other follicular cysts of the skin and subcutaneous tissue: Secondary | ICD-10-CM | POA: Diagnosis not present

## 2024-05-29 MED ORDER — MEGESTROL ACETATE 40 MG PO TABS
40.0000 mg | ORAL_TABLET | Freq: Every day | ORAL | 0 refills | Status: AC
Start: 2024-05-29 — End: ?
  Filled 2024-05-29 – 2024-07-25 (×3): qty 10, 10d supply, fill #0

## 2024-05-29 NOTE — Telephone Encounter (Signed)
 Patient has cancelled her annual exam 4 times and no show once.    FYI

## 2024-05-29 NOTE — Telephone Encounter (Signed)
 Thanks for letting me know!

## 2024-05-29 NOTE — Telephone Encounter (Signed)
.  Med refill request: Megace  Last OV: 10/25/23 Next AEX: Not scheduled sent message to scheduling dept Last MMG (if hormonal med) Refill authorized: Please Advise?

## 2024-05-30 DIAGNOSIS — F339 Major depressive disorder, recurrent, unspecified: Secondary | ICD-10-CM | POA: Diagnosis not present

## 2024-06-01 ENCOUNTER — Other Ambulatory Visit: Payer: Self-pay

## 2024-06-04 ENCOUNTER — Encounter: Payer: Self-pay | Admitting: Physician Assistant

## 2024-06-04 NOTE — Telephone Encounter (Signed)
 Please see pt msg and advise if you would prefer an OV or VV to discuss

## 2024-06-08 ENCOUNTER — Encounter: Payer: 59 | Admitting: Nurse Practitioner

## 2024-06-15 DIAGNOSIS — H5213 Myopia, bilateral: Secondary | ICD-10-CM | POA: Diagnosis not present

## 2024-06-20 ENCOUNTER — Other Ambulatory Visit (HOSPITAL_BASED_OUTPATIENT_CLINIC_OR_DEPARTMENT_OTHER): Payer: Self-pay

## 2024-06-20 DIAGNOSIS — L732 Hidradenitis suppurativa: Secondary | ICD-10-CM | POA: Diagnosis not present

## 2024-06-20 DIAGNOSIS — K130 Diseases of lips: Secondary | ICD-10-CM | POA: Diagnosis not present

## 2024-06-20 DIAGNOSIS — R040 Epistaxis: Secondary | ICD-10-CM | POA: Diagnosis not present

## 2024-06-20 DIAGNOSIS — L700 Acne vulgaris: Secondary | ICD-10-CM | POA: Diagnosis not present

## 2024-06-20 DIAGNOSIS — L853 Xerosis cutis: Secondary | ICD-10-CM | POA: Diagnosis not present

## 2024-06-20 MED ORDER — ISOTRETINOIN 40 MG PO CAPS
40.0000 mg | ORAL_CAPSULE | Freq: Two times a day (BID) | ORAL | 0 refills | Status: AC
  Filled 2024-06-20 – 2024-06-25 (×2): qty 60, 30d supply, fill #0

## 2024-06-21 ENCOUNTER — Other Ambulatory Visit (HOSPITAL_BASED_OUTPATIENT_CLINIC_OR_DEPARTMENT_OTHER): Payer: Self-pay

## 2024-06-25 ENCOUNTER — Other Ambulatory Visit (HOSPITAL_BASED_OUTPATIENT_CLINIC_OR_DEPARTMENT_OTHER): Payer: Self-pay

## 2024-06-27 ENCOUNTER — Other Ambulatory Visit (HOSPITAL_BASED_OUTPATIENT_CLINIC_OR_DEPARTMENT_OTHER): Payer: Self-pay

## 2024-06-28 ENCOUNTER — Other Ambulatory Visit (HOSPITAL_BASED_OUTPATIENT_CLINIC_OR_DEPARTMENT_OTHER): Payer: Self-pay

## 2024-07-02 ENCOUNTER — Ambulatory Visit: Admitting: Nurse Practitioner

## 2024-07-11 DIAGNOSIS — F339 Major depressive disorder, recurrent, unspecified: Secondary | ICD-10-CM | POA: Diagnosis not present

## 2024-07-23 MED FILL — Bupropion HCl Tab ER 24HR 150 MG: ORAL | 90 days supply | Qty: 90 | Fill #1 | Status: AC

## 2024-07-25 ENCOUNTER — Other Ambulatory Visit (HOSPITAL_BASED_OUTPATIENT_CLINIC_OR_DEPARTMENT_OTHER): Payer: Self-pay

## 2024-07-25 ENCOUNTER — Other Ambulatory Visit (HOSPITAL_COMMUNITY): Payer: Self-pay

## 2024-07-25 ENCOUNTER — Encounter (HOSPITAL_COMMUNITY): Payer: Self-pay

## 2024-07-25 ENCOUNTER — Other Ambulatory Visit: Payer: Self-pay

## 2024-07-26 ENCOUNTER — Encounter: Payer: Self-pay | Admitting: Nurse Practitioner

## 2024-07-27 ENCOUNTER — Other Ambulatory Visit (HOSPITAL_BASED_OUTPATIENT_CLINIC_OR_DEPARTMENT_OTHER): Payer: Self-pay

## 2024-07-27 ENCOUNTER — Ambulatory Visit: Admitting: Nurse Practitioner

## 2024-07-27 DIAGNOSIS — R41840 Attention and concentration deficit: Secondary | ICD-10-CM | POA: Diagnosis not present

## 2024-07-27 DIAGNOSIS — Z79899 Other long term (current) drug therapy: Secondary | ICD-10-CM | POA: Diagnosis not present

## 2024-07-27 DIAGNOSIS — F419 Anxiety disorder, unspecified: Secondary | ICD-10-CM | POA: Diagnosis not present

## 2024-07-27 DIAGNOSIS — H9325 Central auditory processing disorder: Secondary | ICD-10-CM | POA: Diagnosis not present

## 2024-07-27 MED ORDER — METHYLPHENIDATE HCL ER (CD) 40 MG PO CPCR
40.0000 mg | ORAL_CAPSULE | Freq: Every day | ORAL | 0 refills | Status: DC
  Filled 2024-07-27: qty 30, 30d supply, fill #0

## 2024-07-27 NOTE — Telephone Encounter (Signed)
 Patient has been complaining of abnormal bleeding with Nexplanon  since insertion. Unable to tolerate IUD insertion. Recommended alternatives. Is she wanting Megace  again or does she want to discuss other options?

## 2024-08-01 ENCOUNTER — Other Ambulatory Visit (HOSPITAL_BASED_OUTPATIENT_CLINIC_OR_DEPARTMENT_OTHER): Payer: Self-pay

## 2024-08-01 DIAGNOSIS — L853 Xerosis cutis: Secondary | ICD-10-CM | POA: Diagnosis not present

## 2024-08-01 DIAGNOSIS — F339 Major depressive disorder, recurrent, unspecified: Secondary | ICD-10-CM | POA: Diagnosis not present

## 2024-08-01 DIAGNOSIS — R040 Epistaxis: Secondary | ICD-10-CM | POA: Diagnosis not present

## 2024-08-01 DIAGNOSIS — K130 Diseases of lips: Secondary | ICD-10-CM | POA: Diagnosis not present

## 2024-08-01 DIAGNOSIS — L700 Acne vulgaris: Secondary | ICD-10-CM | POA: Diagnosis not present

## 2024-08-01 MED ORDER — ISOTRETINOIN 40 MG PO CAPS
ORAL_CAPSULE | ORAL | 0 refills | Status: DC
  Filled 2024-08-01 – 2024-08-07 (×2): qty 60, 30d supply, fill #0

## 2024-08-07 ENCOUNTER — Other Ambulatory Visit (HOSPITAL_BASED_OUTPATIENT_CLINIC_OR_DEPARTMENT_OTHER): Payer: Self-pay

## 2024-08-15 ENCOUNTER — Encounter: Payer: Self-pay | Admitting: Nurse Practitioner

## 2024-08-23 DIAGNOSIS — F339 Major depressive disorder, recurrent, unspecified: Secondary | ICD-10-CM | POA: Diagnosis not present

## 2024-08-24 ENCOUNTER — Other Ambulatory Visit (HOSPITAL_BASED_OUTPATIENT_CLINIC_OR_DEPARTMENT_OTHER): Payer: Self-pay

## 2024-08-27 ENCOUNTER — Other Ambulatory Visit: Payer: Self-pay

## 2024-08-29 ENCOUNTER — Other Ambulatory Visit: Payer: Self-pay

## 2024-08-29 ENCOUNTER — Other Ambulatory Visit (HOSPITAL_BASED_OUTPATIENT_CLINIC_OR_DEPARTMENT_OTHER): Payer: Self-pay

## 2024-08-29 ENCOUNTER — Other Ambulatory Visit: Payer: Self-pay | Admitting: Physician Assistant

## 2024-08-31 ENCOUNTER — Other Ambulatory Visit (HOSPITAL_BASED_OUTPATIENT_CLINIC_OR_DEPARTMENT_OTHER): Payer: Self-pay

## 2024-08-31 MED ORDER — LAMOTRIGINE 150 MG PO TABS
150.0000 mg | ORAL_TABLET | Freq: Every day | ORAL | 1 refills | Status: AC
  Filled 2024-08-31: qty 30, 30d supply, fill #0
  Filled 2024-10-01: qty 30, 30d supply, fill #1

## 2024-09-03 ENCOUNTER — Other Ambulatory Visit (HOSPITAL_BASED_OUTPATIENT_CLINIC_OR_DEPARTMENT_OTHER): Payer: Self-pay

## 2024-09-03 DIAGNOSIS — Z79899 Other long term (current) drug therapy: Secondary | ICD-10-CM | POA: Diagnosis not present

## 2024-09-03 DIAGNOSIS — F419 Anxiety disorder, unspecified: Secondary | ICD-10-CM | POA: Diagnosis not present

## 2024-09-03 DIAGNOSIS — F902 Attention-deficit hyperactivity disorder, combined type: Secondary | ICD-10-CM | POA: Diagnosis not present

## 2024-09-03 DIAGNOSIS — H9325 Central auditory processing disorder: Secondary | ICD-10-CM | POA: Diagnosis not present

## 2024-09-03 MED ORDER — METHYLPHENIDATE HCL ER (CD) 40 MG PO CPCR
40.0000 mg | ORAL_CAPSULE | Freq: Every day | ORAL | 0 refills | Status: DC | PRN
  Filled 2024-09-03: qty 30, 30d supply, fill #0

## 2024-09-04 ENCOUNTER — Other Ambulatory Visit (HOSPITAL_BASED_OUTPATIENT_CLINIC_OR_DEPARTMENT_OTHER): Payer: Self-pay

## 2024-09-04 ENCOUNTER — Other Ambulatory Visit: Payer: Self-pay | Admitting: Physician Assistant

## 2024-09-04 DIAGNOSIS — R112 Nausea with vomiting, unspecified: Secondary | ICD-10-CM

## 2024-09-04 MED ORDER — PROMETHAZINE HCL 25 MG PO TABS
25.0000 mg | ORAL_TABLET | Freq: Four times a day (QID) | ORAL | 0 refills | Status: DC | PRN
  Filled 2024-09-04: qty 30, 8d supply, fill #0

## 2024-09-10 ENCOUNTER — Ambulatory Visit
Admission: RE | Admit: 2024-09-10 | Discharge: 2024-09-10 | Disposition: A | Discharge status: Home / Self Care | Attending: Family Medicine | Admitting: Family Medicine

## 2024-09-10 VITALS — BP 127/85 | HR 118 | Temp 98.4°F | Resp 18 | Ht 60.0 in | Wt 180.0 lb

## 2024-09-10 DIAGNOSIS — J069 Acute upper respiratory infection, unspecified: Secondary | ICD-10-CM | POA: Diagnosis not present

## 2024-09-10 MED ORDER — PREDNISONE 20 MG PO TABS: 40.0000 mg | ORAL_TABLET | Freq: Every day | ORAL | 0 refills | Status: AC

## 2024-09-10 MED ORDER — AZITHROMYCIN 250 MG PO TABS
ORAL_TABLET | ORAL | 0 refills | Status: AC
  Filled 2024-09-11: qty 6, 5d supply, fill #0

## 2024-09-10 NOTE — ED Provider Notes (Signed)
 " [facility]    CSN: [id number] Arrival date & time: 09/10/24  1122      History   Chief Complaint Chief Complaint  Patient presents with   Cough    i have had a cough for a few days now. combination between wet and dry cough and coughing up yellow mucus. throat hurts really bad when i cough. losing my voice from coughing so hard - Entered by patient    HPI Leana Springston is a 24 y.o. female.   HPI Patient is here for an upper respiratory infection.  Runny stuffy nose scratchy throat.  No fever chills or bodyaches to suggest flu.  No known exposure to illness. Good health  Past Medical History:  Diagnosis Date   ADHD (attention deficit hyperactivity disorder)    Dr. Marcello   Anemia    Anxiety    Depression    Environmental allergies     Patient Active Problem List   Diagnosis Date Noted   Allergy 12/20/2023   Headache 12/20/2023   ADHD (attention deficit hyperactivity disorder) 12/20/2023   PCOS (polycystic ovarian syndrome) 12/20/2023   Endometriosis 12/20/2023   Diarrhea 08/25/2021   Other allergic rhinitis 07/15/2021   Allergic conjunctivitis of both eyes 07/15/2021   Migraine without aura and without status migrainosus, not intractable 11/26/2020   Elevated LFTs 03/18/2020   Obesity (BMI 30.0-34.9) 03/17/2020   Depression with anxiety 03/08/2019   Acne 11/15/2017   H/O tics 01/03/2014   Transient alteration of awareness 06/14/2013   Auditory processing disorder 05/04/2013   ADD (attention deficit disorder) 07/05/2011    Past Surgical History:  Procedure Laterality Date   WISDOM TOOTH EXTRACTION  2019    OB History     Gravida  0   Para  0   Term  0   Preterm  0   AB  0   Living  0      SAB  0   IAB  0   Ectopic  0   Multiple  0   Live Births  0            Home Medications    Prior to Admission medications  Medication Sig Start Date End Date Taking? Authorizing Provider  azithromycin  (ZITHROMAX  Z-PAK) 250 MG  tablet Take two pills today followed by one a day until gone 09/10/24  Yes Maranda Jamee Jacob, MD  buPROPion  (WELLBUTRIN  XL) 150 MG 24 hr tablet Take 1 tablet (150 mg total) by mouth daily for mood, focus and concentration. 03/12/24  Yes Allwardt, Alyssa M, PA-C  ISOtretinoin  (ACCUTANE ) 40 MG capsule Take 1 capsule (40 mg total) by mouth 2 (two) times daily with a fatty meal. 06/20/24  Yes   lamoTRIgine  (LAMICTAL ) 150 MG tablet Take 1 tablet (150 mg total) by mouth at bedtime. 08/31/24  Yes Allwardt, Alyssa M, PA-C  megestrol  (MEGACE ) 40 MG tablet Take 1 tablet (40 mg total) by mouth daily. 05/29/24  Yes Prentiss Riggs A, NP  methylphenidate  (METADATE  CD) 40 MG CR capsule Take 1 capsule (40 mg total) by mouth daily. 07/27/24  Yes   predniSONE  (DELTASONE ) 20 MG tablet Take 2 tablets (40 mg total) by mouth daily with breakfast. 09/10/24  Yes Maranda Jamee Jacob, MD  propranolol  (INDERAL ) 10 MG tablet Take 1 tablet (10 mg total) by mouth 3 (three) times daily as needed for anxiety (monitor blood pressure while taking- hold for Systolic BP less than 100 and Diastolic BP less than 60) 03/12/24  Yes Allwardt, Alyssa M, PA-C  cetirizine (ZYRTEC) 10 MG chewable tablet Chew 10 mg by mouth daily.    [provider]  ISOtretinoin  (ACCUTANE ) 30 MG capsule Take 1 capsule (30 mg total) by mouth 2 (two) times daily with a fatty meal. 01/10/24     lamoTRIgine  (LAMICTAL ) 150 MG tablet Take 1 tablet (150 mg total) by mouth at bedtime. 03/05/24   Allwardt, Alyssa M, PA-C  misoprostol  (CYTOTEC ) 200 MCG tablet Place 2 tablets (400 mcg total) vaginally once for 1 dose. Insert 12 hours vaginally prior to procedure 03/26/24 03/30/24  Prentiss Riggs A, NP  propranolol  (INDERAL ) 10 MG tablet Take 1 tablet (10 mg total) by mouth 3 (three) times daily as needed for anxiety (monitor blood pressure while taking- hold for Systolic BP less than 100 and Diastolic BP less than 60). 03/14/24     SUMAtriptan  (IMITREX ) 50 MG tablet Take 1  tablet (50 mg total) by mouth once as needed for migraine. May repeat in 2 hours if headache persists or recurs. 10/31/23 10/31/24  Douglass Kenney NOVAK, FNP  escitalopram  (LEXAPRO ) 20 MG tablet Take      1 tablet      Daily       for Mood Patient not taking: Reported on 03/30/2024 08/13/20 10/01/20  Tonita Fallow, MD    Family History Family History  Problem Relation Age of Onset   Allergic rhinitis Mother    ADD / ADHD Mother    Allergic rhinitis Father    Hypertension Father    Migraines Father    ADD / ADHD Father    Depression Father    ADD / ADHD Brother    Diabetes type I Brother    Depression Brother    Diabetes Brother    Migraines Maternal Aunt    ADD / ADHD Maternal Aunt    Hypothyroidism Paternal Aunt    Asthma Maternal Grandmother    Migraines Maternal Grandmother    ADD / ADHD Maternal Grandmother    Arthritis Maternal Grandmother    ADD / ADHD Maternal Grandfather    ADD / ADHD Paternal Grandmother    ADD / ADHD Paternal Grandfather    Autism Cousin        Maternal 1st Cousin   ADD / ADHD Cousin        Several Paternal 1st Cousins have ADHD   ADD / ADHD Maternal Aunt    ADD / ADHD Paternal Aunt    ADD / ADHD Paternal Uncle    ADD / ADHD Paternal Aunt    ADD / ADHD Paternal Uncle     Social History Social History[1]   Allergies   Strawberry flavoring agent (non-screening) and Lactose intolerance (gi)   Review of Systems Review of Systems  See HPI Physical Exam Triage Vital Signs ED Triage Vitals  Encounter Vitals Group     BP 09/10/24 1150 127/85     Girls Systolic BP Percentile --      Girls Diastolic BP Percentile --      Boys Systolic BP Percentile --      Boys Diastolic BP Percentile --      Pulse Rate 09/10/24 1150 (!) 118     Resp 09/10/24 1150 18     Temp 09/10/24 1150 98.4 F (36.9 C)     Temp Source 09/10/24 1150 Oral     SpO2 09/10/24 1150 95 %     Weight 09/10/24 1147 180 lb (81.6 kg)     Height  09/10/24 1147 5' (1.524 m)     Head  Circumference --      Peak Flow --      Pain Score 09/10/24 1225 0     Pain Loc --      Pain Education --      Exclude from Growth Chart --    No data found.  Updated Vital Signs BP 127/85 (BP Location: Right Arm)   Pulse (!) 118   Temp 98.4 F (36.9 C) (Oral)   Resp 18   Ht 5' (1.524 m)   Wt 81.6 kg   LMP 09/09/2024   SpO2 95%   BMI 35.15 kg/m      Physical Exam Constitutional:      General: She is not in acute distress.    Appearance: She is well-developed and normal weight. She is ill-appearing.  HENT:     Head: Normocephalic and atraumatic.     Right Ear: Tympanic membrane normal.     Left Ear: Tympanic membrane normal.     Nose: Congestion and rhinorrhea present.     Mouth/Throat:     Pharynx: Posterior oropharyngeal erythema present.  Eyes:     Conjunctiva/sclera: Conjunctivae normal.     Pupils: Pupils are equal, round, and reactive to light.  Cardiovascular:     Rate and Rhythm: Normal rate and regular rhythm.     Heart sounds: Normal heart sounds.  Pulmonary:     Effort: Pulmonary effort is normal. No respiratory distress.     Breath sounds: Normal breath sounds.  Musculoskeletal:        General: Normal range of motion.     Cervical back: Normal range of motion.  Lymphadenopathy:     Cervical: No cervical adenopathy.  Skin:    General: Skin is warm and dry.  Neurological:     Mental Status: She is alert.      UC Treatments / Results  Labs (all labs ordered are listed, but only abnormal results are displayed) Labs Reviewed - No data to display  EKG   Radiology No results found.  Procedures Procedures (including critical care time)  Medications Ordered in UC Medications - No data to display  Initial Impression / Assessment and Plan / UC Course  I have reviewed the triage vital signs and the nursing notes.  Pertinent labs & imaging results that were available during my care of the patient were reviewed by me and considered in my  medical decision making (see chart for details).     Discussed patient has a viral upper respiratory infection.  Will treat her with prednisone  for her congestion symptoms.  Will add on a Z-Pak prescription to take if she fails to improve over the next few days Final Clinical Impressions(s) / UC Diagnoses   Final diagnoses:  Acute upper respiratory infection     Discharge Instructions      Take prednisone  once a day for 5 days May take over-the-counter cough and cold medicines Make sure you are drinking lots of water I have given you a written prescription for a Z-Pak.  If you fail to improve over the next few days, you can fill the antibiotic Follow-up with your primary care   ED Prescriptions     Medication Sig Dispense Auth. Provider   predniSONE  (DELTASONE ) 20 MG tablet Take 2 tablets (40 mg total) by mouth daily with breakfast. 10 tablet Maranda Jamee Jacob, MD   azithromycin  (ZITHROMAX  Z-PAK) 250 MG tablet Take two pills today followed  by one a day until gone 6 tablet Maranda Jamee Jacob, MD      PDMP not reviewed this encounter.    [1]  Social History Tobacco Use   Smoking status: Never    Passive exposure: Never   Smokeless tobacco: Never  Vaping Use   Vaping status: Some Days  Substance Use Topics   Alcohol use: Yes    Comment: occasionally   Drug use: Never     Maranda Jamee Jacob, MD 09/10/24 1444  "

## 2024-09-10 NOTE — Discharge Instructions (Signed)
 Take prednisone  once a day for 5 days May take over-the-counter cough and cold medicines Make sure you are drinking lots of water I have given you a written prescription for a Z-Pak.  If you fail to improve over the next few days, you can fill the antibiotic Follow-up with your primary care

## 2024-09-10 NOTE — ED Triage Notes (Signed)
 Pt states that he has had a cough and loss of voice.  X3-4 days  Pt states that she has been taking Mucinex.  Pt states that her throat is sore but due to the cough.

## 2024-09-11 ENCOUNTER — Other Ambulatory Visit (HOSPITAL_BASED_OUTPATIENT_CLINIC_OR_DEPARTMENT_OTHER): Payer: Self-pay

## 2024-09-17 ENCOUNTER — Encounter: Payer: Self-pay | Admitting: Nurse Practitioner

## 2024-09-17 NOTE — Telephone Encounter (Signed)
 Yes. Schedule with [facility]. Will need to do Cytotec  prior again. Please remind her how to do this - insert into vagina at least 12 hours prior to procedure time. Ibuprofen  800 mg 30-60 minutes before. Can do Valium  again but will need driver and consent prior.

## 2024-10-05 ENCOUNTER — Other Ambulatory Visit (HOSPITAL_BASED_OUTPATIENT_CLINIC_OR_DEPARTMENT_OTHER): Payer: Self-pay

## 2024-10-05 ENCOUNTER — Other Ambulatory Visit: Payer: Self-pay

## 2024-10-05 MED ORDER — METHYLPHENIDATE HCL ER (CD) 40 MG PO CPCR
40.0000 mg | ORAL_CAPSULE | Freq: Every day | ORAL | 0 refills | Status: AC
  Filled 2024-10-05: qty 30, 30d supply, fill #0
